# Patient Record
Sex: Male | Born: 1985 | Race: Black or African American | Hispanic: No | Smoking: Former smoker
Health system: Southern US, Community
[De-identification: ages and names within clinical notes are randomized; demographics above are authoritative.]

## PROBLEM LIST (undated history)

## (undated) DIAGNOSIS — J45909 Unspecified asthma, uncomplicated: Secondary | ICD-10-CM

---

## 2016-01-10 ENCOUNTER — Ambulatory Visit: Payer: Self-pay

## 2016-01-10 ENCOUNTER — Other Ambulatory Visit: Payer: Self-pay | Admitting: Occupational Medicine

## 2016-01-10 DIAGNOSIS — Z021 Encounter for pre-employment examination: Secondary | ICD-10-CM

## 2017-06-20 DIAGNOSIS — I10 Essential (primary) hypertension: Secondary | ICD-10-CM | POA: Insufficient documentation

## 2017-08-25 IMAGING — CR DG CHEST 1V
1 series · 1 of 1 positions shown · non-contrast
Comparison: None.

CLINICAL DATA: Pre-employment exam

EXAM:
CHEST 1 VIEW

[view not recorded]
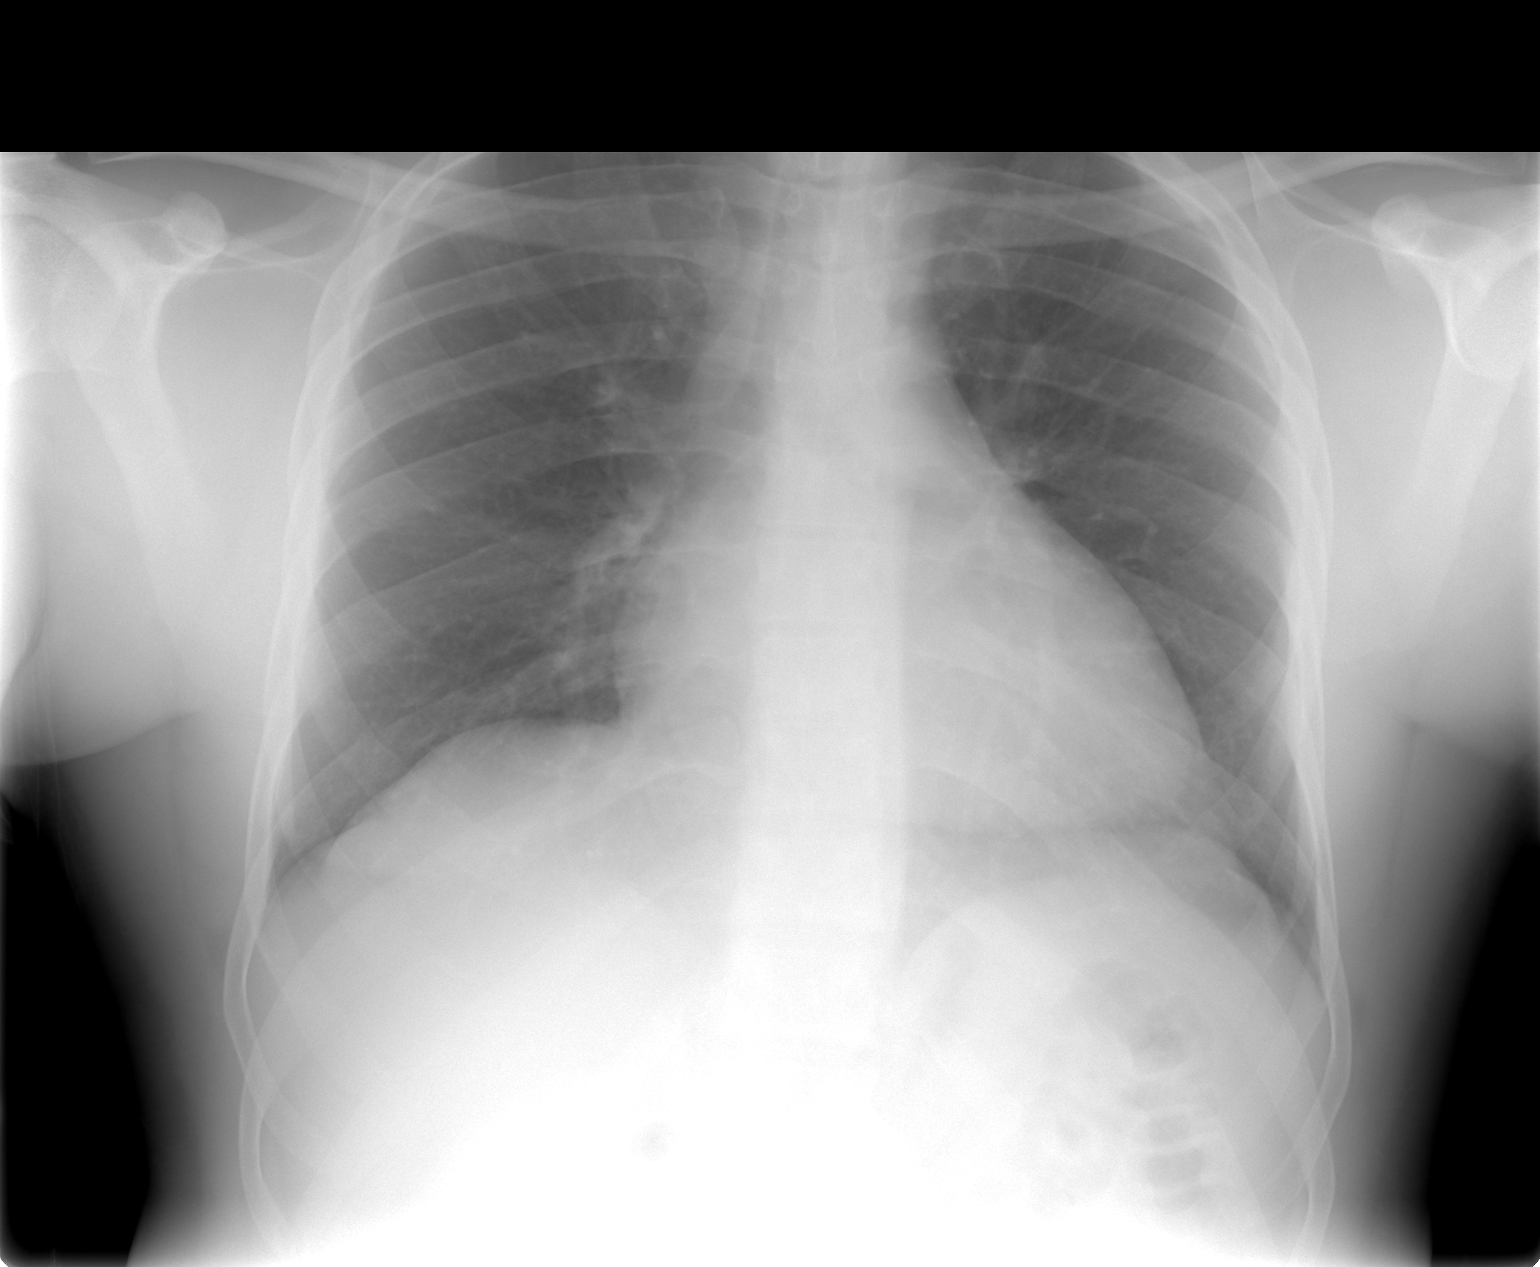

[1 of 1 positions shown; findings below may reference images not displayed]

FINDINGS: Cardiac shadow is mildly enlarged but accentuated by frontal
technique. No focal infiltrate or sizable effusion is seen. No bony
abnormality is noted.
IMPRESSION: No acute abnormality seen.

## 2017-12-28 ENCOUNTER — Ambulatory Visit: Payer: Self-pay | Admitting: Family Medicine

## 2020-04-16 ENCOUNTER — Emergency Department (HOSPITAL_BASED_OUTPATIENT_CLINIC_OR_DEPARTMENT_OTHER)
Admission: EM | Admit: 2020-04-16 | Discharge: 2020-04-16 | Disposition: A | Discharge status: Home / Self Care | Payer: Commercial Managed Care - PPO | Attending: Emergency Medicine | Admitting: Emergency Medicine

## 2020-04-16 ENCOUNTER — Other Ambulatory Visit: Payer: Self-pay

## 2020-04-16 ENCOUNTER — Encounter (HOSPITAL_BASED_OUTPATIENT_CLINIC_OR_DEPARTMENT_OTHER): Payer: Self-pay | Admitting: Emergency Medicine

## 2020-04-16 DIAGNOSIS — J45909 Unspecified asthma, uncomplicated: Secondary | ICD-10-CM | POA: Diagnosis not present

## 2020-04-16 DIAGNOSIS — R059 Cough, unspecified: Secondary | ICD-10-CM | POA: Diagnosis present

## 2020-04-16 DIAGNOSIS — R058 Other specified cough: Secondary | ICD-10-CM

## 2020-04-16 DIAGNOSIS — R0602 Shortness of breath: Secondary | ICD-10-CM | POA: Diagnosis not present

## 2020-04-16 DIAGNOSIS — Z87891 Personal history of nicotine dependence: Secondary | ICD-10-CM | POA: Insufficient documentation

## 2020-04-16 HISTORY — DX: Unspecified asthma, uncomplicated: J45.909

## 2020-04-16 MED ORDER — ALBUTEROL SULFATE HFA 108 (90 BASE) MCG/ACT IN AERS
1.0000 | INHALATION_SPRAY | Freq: Once | RESPIRATORY_TRACT | Status: AC
  Administered 2020-04-16: 1 via RESPIRATORY_TRACT
  Filled 2020-04-16: qty 6.7

## 2020-04-16 MED ORDER — CETIRIZINE HCL 10 MG PO TABS: 10.0000 mg | ORAL_TABLET | Freq: Every day | ORAL | 0 refills | Status: DC

## 2020-04-16 MED ORDER — PREDNISONE 10 MG PO TABS: 40.0000 mg | ORAL_TABLET | Freq: Every day | ORAL | 0 refills | Status: AC

## 2020-04-16 MED ORDER — AEROCHAMBER PLUS FLO-VU LARGE MISC
1.0000 | Freq: Once | Status: AC
  Administered 2020-04-16: 1
  Filled 2020-04-16: qty 1

## 2020-04-16 MED ORDER — OMEPRAZOLE 20 MG PO CPDR: 20.0000 mg | DELAYED_RELEASE_CAPSULE | Freq: Every day | ORAL | 0 refills | Status: DC

## 2020-04-16 MED ORDER — PREDNISONE 50 MG PO TABS
60.0000 mg | ORAL_TABLET | Freq: Once | ORAL | Status: AC
  Administered 2020-04-16: 60 mg via ORAL
  Filled 2020-04-16: qty 1

## 2020-04-16 NOTE — Discharge Instructions (Addendum)
Please take medications as prescribed.  I have prescribed you prednisone for 3 days.  I also recommend taking Zyrtec for the next 2 weeks as well as Prilosec.  As we discussed sometimes these nighttime cough can be caused by reflux and can occasionally be the only symptom of reflux.  Patient plenty of water.  Please monitor your symptoms may always return to the ER for any new or concerning symptoms.  Please follow-up with your primary care doctor at your appointment.

## 2020-04-16 NOTE — ED Triage Notes (Signed)
Pt states he been having issues with sob and coughing while lying down or sleeping for 5 days.  It wakes him up, he starts coughing and feels like he cannot catch his breath.  No acute respiratory distress.  Pt has history of asthma and has been using his daughters albuterol but does not have any meds for himself.

## 2020-04-16 NOTE — ED Provider Notes (Signed)
[facility] EMERGENCY DEPARTMENT Provider Note   CSN: [id number] Arrival date & time: 04/16/20  0932     History Chief Complaint  Patient presents with  . Shortness of Breath    Perry Johnston is a 36 y.o. male.  HPI Patient is a 35 year old male with a past medical history of asthma he states he has not had an inhaler for decades, he states that since Sunday he has been waking up with nocturnal cough.  He states that he feels he has had some wheezing and he feels short of breath because of how intensely he is coughing.  He has no history of sleep apnea and denies any daytime drowsiness.  He states that the coughing episodes will last for 5 to 10 minutes and he states that during these episodes he has several times used his daughter's nebulizer apparatus which he states seemed to help.  He also denies any daytime symptoms.  No chest pain during these episodes.  And states that during the day he has no shortness of breath, cough, congestion, fevers or chills.  No other associated symptoms.  No unilateral or bilateral leg swelling.  No history of heart issues, heart disease and he states that he has an appointment with his primary care doctor this upcoming Monday.    Past Medical History:  Diagnosis Date  . Asthma     There are no problems to display for this patient.   The histories are not reviewed yet. Please review them in the "History" navigator section and refresh this SmartLink.     History reviewed. No pertinent family history.  Social History   Tobacco Use  . Smoking status: Former Smoker    Types: Cigars    Quit date: 04/02/2020    Years since quitting: 0.0  . Smokeless tobacco: Never Used  Vaping Use  . Vaping Use: Never used    Home Medications Prior to Admission medications   Medication Sig Start Date End Date Taking? Authorizing Provider  cetirizine (ZYRTEC ALLERGY) 10 MG tablet Take 1 tablet (10 mg total) by mouth daily. 04/16/20  Yes Fondaw,  Wylder S, PA  omeprazole (PRILOSEC) 20 MG capsule Take 1 capsule (20 mg total) by mouth daily. 04/16/20  Yes Fondaw, Wylder S, PA  predniSONE (DELTASONE) 10 MG tablet Take 4 tablets (40 mg total) by mouth daily for 3 days. 04/16/20 04/19/20 Yes Gailen Shelter, PA    Allergies    Patient has no known allergies.  Review of Systems   Review of Systems  Constitutional: Negative for chills and fever.  HENT: Negative for congestion.   Eyes: Negative for pain.  Respiratory: Positive for cough (At night).   Cardiovascular: Negative for chest pain and leg swelling.  Gastrointestinal: Negative for abdominal pain and vomiting.  Genitourinary: Negative for dysuria.  Musculoskeletal: Negative for myalgias.  Skin: Negative for rash.  Neurological: Negative for dizziness and headaches.    Physical Exam Updated Vital Signs BP (!) 158/67   Pulse 65   Temp 98.3 F (36.8 C) (Oral)   Resp 16   Ht 5\' 7"  (1.702 m)   Wt 83.9 kg   SpO2 100%   BMI 28.98 kg/m   Physical Exam Vitals and nursing note reviewed.  Constitutional:      General: He is not in acute distress. HENT:     Head: Normocephalic and atraumatic.     Nose: Nose normal.  Eyes:     General: No scleral icterus. Cardiovascular:  Rate and Rhythm: Normal rate and regular rhythm.     Pulses: Normal pulses.     Heart sounds: Normal heart sounds.  Pulmonary:     Effort: Pulmonary effort is normal. No respiratory distress.     Breath sounds: No wheezing.     Comments: No tachypnea, speaking full sentences, lungs are clear to auscultation all fields, normal work of breathing Abdominal:     Palpations: Abdomen is soft.     Tenderness: There is no abdominal tenderness.  Musculoskeletal:     Cervical back: Normal range of motion.     Right lower leg: No edema.     Left lower leg: No edema.     Comments: Lower extremities are symmetric with no calf tenderness no lower extremity edema  Skin:    General: Skin is warm and dry.      Capillary Refill: Capillary refill takes less than 2 seconds.  Neurological:     Mental Status: He is alert. Mental status is at baseline.  Psychiatric:        Mood and Affect: Mood normal.        Behavior: Behavior normal.     ED Results / Procedures / Treatments   Labs (all labs ordered are listed, but only abnormal results are displayed) Labs Reviewed - No data to display  EKG None  Radiology No results found.  Procedures Procedures   Medications Ordered in ED Medications  predniSONE (DELTASONE) tablet 60 mg (has no administration in time range)  albuterol (VENTOLIN HFA) 108 (90 Base) MCG/ACT inhaler 1 puff (has no administration in time range)  AeroChamber Plus Flo-Vu Medium MISC 1 each (has no administration in time range)    ED Course  I have reviewed the triage vital signs and the nursing notes.  Pertinent labs & imaging results that were available during my care of the patient were reviewed by me and considered in my medical decision making (see chart for details).    MDM Rules/Calculators/A&P                          Patient is 35 year old male with past medical history significant for asthma presented today with complaints of nocturnal cough.  He has no daytime symptoms.  He is not on any inhalers and no other medications not on ACE inhibitor or ARB.  Differential includes nighttime symptoms of asthma, URI, reflux, PND, allergies  We will provide patient with albuterol inhaler, 3-day course of low-dose of prednisone, also will provide patient with AeroChamber and counseling on how to use inhaler.  We will also prescribe patient Prilosec and Zyrtec to use daily he will follow-up with his PCP.  Return precautions given.  I specifically have low suspicion for PE given that patient is PERC negative and has no chest pain.  Also take note ACS or myocarditis for similar reasons.  Questions answered best my ability.  Patient discharged at this time.  Final Clinical  Impression(s) / ED Diagnoses Final diagnoses:  Nocturnal cough    Rx / DC Orders ED Discharge Orders         Ordered    omeprazole (PRILOSEC) 20 MG capsule  Daily        04/16/20 1121    cetirizine (ZYRTEC ALLERGY) 10 MG tablet  Daily        04/16/20 1121    predniSONE (DELTASONE) 10 MG tablet  Daily        04/16/20 1121  Gailen Shelter, [place] 04/16/20 1126    Rozelle Logan, [place] 04/18/20 [id number]

## 2022-10-11 DIAGNOSIS — R062 Wheezing: Secondary | ICD-10-CM | POA: Diagnosis not present

## 2022-10-11 DIAGNOSIS — J4520 Mild intermittent asthma, uncomplicated: Secondary | ICD-10-CM | POA: Insufficient documentation

## 2022-10-11 DIAGNOSIS — J4531 Mild persistent asthma with (acute) exacerbation: Secondary | ICD-10-CM | POA: Diagnosis not present

## 2022-10-11 DIAGNOSIS — R0602 Shortness of breath: Secondary | ICD-10-CM | POA: Diagnosis not present

## 2022-10-11 DIAGNOSIS — I10 Essential (primary) hypertension: Secondary | ICD-10-CM | POA: Diagnosis not present

## 2022-10-17 DIAGNOSIS — I10 Essential (primary) hypertension: Secondary | ICD-10-CM | POA: Diagnosis not present

## 2022-10-21 DIAGNOSIS — L509 Urticaria, unspecified: Secondary | ICD-10-CM | POA: Diagnosis not present

## 2022-10-21 DIAGNOSIS — L209 Atopic dermatitis, unspecified: Secondary | ICD-10-CM | POA: Diagnosis not present

## 2023-01-04 ENCOUNTER — Encounter (HOSPITAL_COMMUNITY): Payer: Self-pay

## 2023-01-04 ENCOUNTER — Emergency Department (HOSPITAL_COMMUNITY)
Admission: EM | Admit: 2023-01-04 | Discharge: 2023-01-05 | Disposition: A | Discharge status: Home / Self Care | Payer: Self-pay | Attending: Emergency Medicine | Admitting: Emergency Medicine

## 2023-01-04 ENCOUNTER — Emergency Department (HOSPITAL_COMMUNITY): Payer: Self-pay

## 2023-01-04 ENCOUNTER — Other Ambulatory Visit: Payer: Self-pay

## 2023-01-04 DIAGNOSIS — R0989 Other specified symptoms and signs involving the circulatory and respiratory systems: Secondary | ICD-10-CM | POA: Diagnosis not present

## 2023-01-04 DIAGNOSIS — R0689 Other abnormalities of breathing: Secondary | ICD-10-CM | POA: Diagnosis not present

## 2023-01-04 DIAGNOSIS — J45909 Unspecified asthma, uncomplicated: Secondary | ICD-10-CM | POA: Insufficient documentation

## 2023-01-04 DIAGNOSIS — R0789 Other chest pain: Secondary | ICD-10-CM | POA: Diagnosis not present

## 2023-01-04 DIAGNOSIS — R079 Chest pain, unspecified: Secondary | ICD-10-CM | POA: Insufficient documentation

## 2023-01-04 DIAGNOSIS — R000 Tachycardia, unspecified: Secondary | ICD-10-CM | POA: Diagnosis not present

## 2023-01-04 DIAGNOSIS — I213 ST elevation (STEMI) myocardial infarction of unspecified site: Secondary | ICD-10-CM | POA: Diagnosis not present

## 2023-01-04 LAB — CBC WITH DIFFERENTIAL/PLATELET
Abs Immature Granulocytes: 0.01 10*3/uL (ref 0.00–0.07)
Basophils Absolute: 0.1 10*3/uL (ref 0.0–0.1)
Basophils Relative: 2 %
Eosinophils Absolute: 0.4 10*3/uL (ref 0.0–0.5)
Eosinophils Relative: 7 %
HCT: 43.6 % (ref 39.0–52.0)
Hemoglobin: 14.8 g/dL (ref 13.0–17.0)
Immature Granulocytes: 0 %
Lymphocytes Relative: 39 %
Lymphs Abs: 1.9 10*3/uL (ref 0.7–4.0)
MCH: 29 pg (ref 26.0–34.0)
MCHC: 33.9 g/dL (ref 30.0–36.0)
MCV: 85.3 fL (ref 80.0–100.0)
Monocytes Absolute: 0.4 10*3/uL (ref 0.1–1.0)
Monocytes Relative: 9 %
Neutro Abs: 2.1 10*3/uL (ref 1.7–7.7)
Neutrophils Relative %: 43 %
Platelets: 201 10*3/uL (ref 150–400)
RBC: 5.11 MIL/uL (ref 4.22–5.81)
RDW: 12.4 % (ref 11.5–15.5)
WBC: 4.8 10*3/uL (ref 4.0–10.5)
nRBC: 0 % (ref 0.0–0.2)

## 2023-01-04 LAB — COMPREHENSIVE METABOLIC PANEL
ALT: 21 U/L (ref 0–44)
AST: 22 U/L (ref 15–41)
Albumin: 3.9 g/dL (ref 3.5–5.0)
Alkaline Phosphatase: 44 U/L (ref 38–126)
Anion gap: 9 (ref 5–15)
BUN: 15 mg/dL (ref 6–20)
CO2: 23 mmol/L (ref 22–32)
Calcium: 9.3 mg/dL (ref 8.9–10.3)
Chloride: 106 mmol/L (ref 98–111)
Creatinine, Ser: 1.25 mg/dL — ABNORMAL HIGH (ref 0.61–1.24)
GFR, Estimated: >60 mL/min (ref >60)
Glucose, Bld: 92 mg/dL (ref 70–99)
Potassium: 3.7 mmol/L (ref 3.5–5.1)
Sodium: 138 mmol/L (ref 135–145)
Total Bilirubin: 0.8 mg/dL (ref <1.2)
Total Protein: 6.6 g/dL (ref 6.5–8.1)

## 2023-01-04 LAB — TROPONIN I (HIGH SENSITIVITY): Troponin I (High Sensitivity): 5 ng/L (ref <18)

## 2023-01-04 LAB — BRAIN NATRIURETIC PEPTIDE: B Natriuretic Peptide: 8 pg/mL (ref 0.0–100.0)

## 2023-01-04 NOTE — ED Provider Notes (Signed)
[place] EMERGENCY DEPARTMENT AT [facility] Provider Note   CSN: [id number] Arrival date & time: 01/04/23  2117     History  Chief Complaint  Patient presents with   Chest Pain    Perry Johnston is a 37 y.o. male with a PMH of asthma who presented to the ED for chest pain.  Patient reports that approximately 6 PM, he started having sharp, left-sided chest pain.  Denies radiation, getting worse with exertion, vomiting, or diaphoresis.  States it has been constant, but now has gone away since being in the ER.  Denies ever having pain like this before.  Denies history of heart attacks, blood clots and reports he thinks he has grown out of his asthma.  Per EMS, they administered the patient aspirin and nitroglycerin.  Patient otherwise denies fevers, chills, cough, congestion, shortness of breath, abdominal pain.   Chest Pain      Home Medications Prior to Admission medications   Medication Sig Start Date End Date Taking? Authorizing Provider  cetirizine (ZYRTEC ALLERGY) 10 MG tablet Take 1 tablet (10 mg total) by mouth daily. 04/16/20   Gailen Shelter, PA  omeprazole (PRILOSEC) 20 MG capsule Take 1 capsule (20 mg total) by mouth daily. 04/16/20   Gailen Shelter, PA      Allergies    Patient has no known allergies.    Review of Systems   Review of Systems  Cardiovascular:  Positive for chest pain.    Physical Exam Updated Vital Signs BP (!) 175/99   Pulse 96   Temp 98.2 F (36.8 C) (Oral)   Resp 18   Ht 5\' 7"  (1.702 m)   Wt 81.6 kg   SpO2 100%   BMI 28.19 kg/m  Physical Exam Constitutional:      General: He is not in acute distress.    Appearance: He is not ill-appearing.  HENT:     Head: Normocephalic and atraumatic.  Eyes:     Extraocular Movements: Extraocular movements intact.     Pupils: Pupils are equal, round, and reactive to light.  Cardiovascular:     Rate and Rhythm: Normal rate and regular rhythm.     Heart sounds: Normal heart  sounds. No murmur heard.    No friction rub. No gallop.  Pulmonary:     Effort: Pulmonary effort is normal.     Breath sounds: Normal breath sounds. No wheezing, rhonchi or rales.  Chest:     Comments: No rashes on chest, no tenderness to palpation over the chest wall Abdominal:     Tenderness: There is no abdominal tenderness. There is no guarding or rebound.  Musculoskeletal:     Cervical back: Normal range of motion and neck supple.     Right lower leg: No edema.     Left lower leg: No edema.  Neurological:     Mental Status: He is alert.    ED Results / Procedures / Treatments   Labs (all labs ordered are listed, but only abnormal results are displayed) Labs Reviewed  COMPREHENSIVE METABOLIC PANEL - Abnormal; Notable for the following components:      Result Value   Creatinine, Ser 1.25 (*)    All other components within normal limits  BRAIN NATRIURETIC PEPTIDE  CBC WITH DIFFERENTIAL/PLATELET  TROPONIN I (HIGH SENSITIVITY)  TROPONIN I (HIGH SENSITIVITY)    EKG None  Radiology DG Chest Portable 1 View  Result Date: 01/04/2023 CLINICAL DATA:  chest pain EXAM: PORTABLE CHEST 1  VIEW COMPARISON:  Chest x-ray 01/10/2016 FINDINGS: The heart and mediastinal contours are within normal limits. Low lung volumes . no focal consolidation. No pulmonary edema. No pleural effusion. No pneumothorax. No acute osseous abnormality. IMPRESSION: Low lung volumes with no active disease. Electronically Signed   By: Tish Frederickson M.D.   On: 01/04/2023 21:50    Procedures Procedures    Medications Ordered in ED Medications - No data to display  ED Course/ Medical Decision Making/ A&P                                 Medical Decision Making Amount and/or Complexity of Data Reviewed Labs: ordered. Radiology: ordered.   Vital signs stable, patient hypertensive.  Physical exam reassuring.  CBC with no leukocytosis or anemia.  Metabolic panel with no gross metabolic or electrolyte  abnormality.  BNP 8.0.  Initial troponin 5.  Patient has a low risk HEAR score, I feel he will be suitable for discharge with outpatient follow-up pending the result of his second troponin.  Presentation is not consistent with aortic dissection or esophageal rupture and chest x-ray showed no evidence of pneumothorax or pneumonia.  He has no rash concerning for shingles on his chest.  Patient is a low risk Wells score and PE can be excluded via the PERC rule.  Patient was signed out to the oncoming provider in stable condition, pending repeat troponin.        Final Clinical Impression(s) / ED Diagnoses Final diagnoses:  Chest pain, unspecified type    Rx / DC Orders ED Discharge Orders     None         Janyth Pupa, MD 01/04/23 2345    Glyn Ade, MD 01/05/23 [phone]

## 2023-01-04 NOTE — ED Triage Notes (Signed)
Patient BIB [facility] from home for CP starting 3 hrs ago, progressing over time and worse on exertion. Patient is hypertensive, 210/100, 324mg  aspirin and 3 nitroglycerin administered en route which improved CP from 7/10 to 4/10. Last BP 170/90, HR 100, 97% RA, RR 18, Cbg 106. No previous cardiac hx. Patient A&Ox4, ambulatory with EMS.

## 2023-01-05 LAB — TROPONIN I (HIGH SENSITIVITY): Troponin I (High Sensitivity): 5 ng/L (ref <18)

## 2023-01-05 NOTE — ED Notes (Signed)
Pt refused discharge BP

## 2023-01-05 NOTE — Discharge Instructions (Signed)
Your heart markers looked good.  We recommend that you follow-up with your regular doctor.  Return for new or worsening symptoms.

## 2023-01-05 NOTE — ED Provider Notes (Signed)
Patient requesting discharge.  Repeat trop is pending.  12:37 AM Repeat trop is flat and negative.  DC to home with PCP followup.   Roxy Horseman, PA-C 01/05/23 0038    Gilda Crease, MD 01/05/23 [phone]

## 2023-02-01 ENCOUNTER — Telehealth: Payer: Self-pay | Admitting: General Practice

## 2023-02-01 ENCOUNTER — Ambulatory Visit: Payer: BC Managed Care – PPO | Admitting: Internal Medicine

## 2023-02-01 NOTE — Telephone Encounter (Signed)
Pt came in late. Arrive at 2.19pm for his 2.00pm  - approved 1x reschedule

## 2023-02-14 ENCOUNTER — Ambulatory Visit: Payer: BC Managed Care – PPO | Admitting: Internal Medicine

## 2023-02-14 ENCOUNTER — Encounter: Payer: Self-pay | Admitting: Internal Medicine

## 2023-02-14 VITALS — BP 120/80 | HR 61 | Temp 97.7°F | Ht 67.0 in | Wt 182.0 lb

## 2023-02-14 DIAGNOSIS — R030 Elevated blood-pressure reading, without diagnosis of hypertension: Secondary | ICD-10-CM | POA: Insufficient documentation

## 2023-02-14 DIAGNOSIS — J4520 Mild intermittent asthma, uncomplicated: Secondary | ICD-10-CM | POA: Diagnosis not present

## 2023-02-14 MED ORDER — ALBUTEROL SULFATE HFA 108 (90 BASE) MCG/ACT IN AERS: 2.0000 | INHALATION_SPRAY | RESPIRATORY_TRACT | 2 refills | Status: DC | PRN

## 2023-02-14 NOTE — Progress Notes (Signed)
 [facility] [address] [place] [address] Dept: [phone] Dept Fax: [phone]  New Patient Office Visit  Subjective:   Perry Johnston 1985/11/17 02/14/2023  Chief Complaint  Patient presents with   Establish Care    HPI: Perry Johnston presents today to establish care at [facility]. Introduced to publishing rights manager role and practice setting.  All questions answered.  Concerns: See below   Discussed the use of AI scribe software for clinical note transcription with the patient, who gave verbal consent to proceed.  History of Present Illness   The patient, with a history of asthma and elevated BP readings in the past without diagnosis of HTN, presents to establish care. They report using an albuterol  inhaler for asthma approximately twice a week, typically after eating large meals. They deny experiencing asthma symptoms during exercise. They have run out of their inhaler and request a refill.  The patient also reports a history of elevated BP readings, with a recent episode of severe chest pain leading to an emergency room visit. At that time, their blood pressure was nearly 200. They deny currently taking any medication for high blood pressure. They have made lifestyle changes, including dietary modifications and increased exercise, to manage their blood pressure. They report a recent blood pressure reading of 130 systolic when they checked it at the pharmacy. They express a desire to avoid taking medication for blood pressure if possible.  The patient also mentions a family history of heart disease, with both parents having heart conditions. They express concern about this and a desire to take preventative measures. They request a physical examination, as they have not had one in over a year.         The following portions of the patient's history were reviewed and updated as appropriate:  past medical history, past surgical history, family history, social history, allergies, medications, and problem list.   Patient Active Problem List   Diagnosis Date Noted   Elevated BP without diagnosis of hypertension 02/14/2023   Mild intermittent asthma without complication 10/11/2022   Past Medical History:  Diagnosis Date   Asthma    History reviewed. No pertinent surgical history. Family History  Problem Relation Age of Onset   Heart disease Mother    Heart disease Father     Current Outpatient Medications:    albuterol  (VENTOLIN  HFA) 108 (90 Base) MCG/ACT inhaler, Inhale 2 puffs into the lungs every 4 (four) hours as needed for wheezing or shortness of breath., Disp: 18 g, Rfl: 2 No Known Allergies  ROS: A complete ROS was performed with pertinent positives/negatives noted in the HPI. The remainder of the ROS are negative.   Objective:   Today's Vitals   02/14/23 0842 02/14/23 0900  BP: 104/70 120/80  Pulse: 61   Temp: 97.7 F (36.5 C)   TempSrc: Temporal   SpO2: 98%   Weight: 182 lb (82.6 kg)   Height: 5' 7 (1.702 m)     GENERAL: Well-appearing, in NAD. Well nourished.  SKIN: Pink, warm and dry. No rash, lesion, ulceration, or ecchymoses.  NECK: Trachea midline. Full ROM w/o pain or tenderness. No lymphadenopathy.  RESPIRATORY: Chest wall symmetrical. Respirations even and non-labored. Breath sounds clear to auscultation bilaterally.  CARDIAC: S1, S2 present, regular rate and rhythm. Peripheral pulses 2+ bilaterally.  EXTREMITIES: Without clubbing, cyanosis, or edema.  NEUROLOGIC:  Steady, even gait.  PSYCH/MENTAL STATUS: Alert, oriented x 3. Cooperative, appropriate mood and  affect.   Health Maintenance Due  Topic Date Due   HIV Screening  Never done   Hepatitis C Screening  Never done   COVID-19 Vaccine (3 - 2024-25 season) 10/08/2022    No results found for any visits on 02/14/23.  Assessment & Plan:  Assessment and Plan    Asthma Infrequent  use of Albuterol  inhaler, approximately twice a week, primarily after meals. No exercise-induced symptoms reported. -Refill Albuterol  inhaler prescription and send to [facility] pharmacy.  Elevated BP without diagnosis of Hypertension -Continue lifestyle modifications. - discussed low sodium diet  -Check blood pressure regularly at home or at local pharmacy. -Return to clinic if blood pressure readings consistently exceed 140/90.  General Health Maintenance / Follow-up Plans -Schedule physical examination in six weeks. -Plan for fasting blood work at time of physical examination.       No orders of the defined types were placed in this encounter.  Meds ordered this encounter  Medications   albuterol  (VENTOLIN  HFA) 108 (90 Base) MCG/ACT inhaler    Sig: Inhale 2 puffs into the lungs every 4 (four) hours as needed for wheezing or shortness of breath.    Dispense:  18 g    Refill:  2    Supervising Provider:   SEBASTIAN BEVERLEY NOVAK [[id number]]    Return in about 6 weeks (around 03/28/2023) for Fasting Annual Physical Exam.   Rosina Senters, FNP

## 2023-02-14 NOTE — Patient Instructions (Signed)
 BLOOD PRESSURE: Obtain an automatic blood pressure machine if you do not have one.   Check your blood pressure at home, write down blood pressure readings and bring to next appointment.  Goal is BP less than 140/90 consistently.  Adhere to a low salt diet ( no more than 1500mg  of salt/sodium per day) and exercise regularly   The nutrition facts label is a good place to find how much sodium is in foods. Look for products with no more than 400 mg of sodium per serving.  Remember that 1.5 g = 1500 mg.  The food label may also list foods as:  Sodium-free: Less than 5 mg in a serving.  Very low sodium: 35 mg or less in a serving.  Low-sodium: 140 mg or less in a serving.  Light in sodium: 50% less sodium in a serving. For example, if a food that usually has 300 mg of sodium is changed to become light in sodium, it will have 150 mg of sodium.  Reduced sodium: 25% less sodium in a serving. For example, if a food that usually has 400 mg of sodium is changed to reduced sodium, it will have 300 mg of sodium.

## 2023-02-21 ENCOUNTER — Ambulatory Visit: Payer: BC Managed Care – PPO | Admitting: Internal Medicine

## 2023-03-09 ENCOUNTER — Encounter: Payer: Self-pay | Admitting: Family Medicine

## 2023-03-09 ENCOUNTER — Ambulatory Visit: Payer: BC Managed Care – PPO | Admitting: Family Medicine

## 2023-03-09 VITALS — BP 146/84 | HR 76 | Temp 97.5°F | Wt 182.6 lb

## 2023-03-09 DIAGNOSIS — Z113 Encounter for screening for infections with a predominantly sexual mode of transmission: Secondary | ICD-10-CM | POA: Diagnosis not present

## 2023-03-09 NOTE — Addendum Note (Signed)
Addended by: Trudee Kuster on: 03/09/2023 04:04 PM   Modules accepted: Orders

## 2023-03-09 NOTE — Progress Notes (Signed)
Assessment/Plan:   Assessment and Plan    Sexually Transmitted Infection (STI) Screening Presents for routine STI screening. Asymptomatic with no history of STIs or known exposures. In a relationship and seeks testing for reassurance and safety. Informed consent obtained; discussed barrier protection for STI and pregnancy prevention. - Order urine and blood tests for gonorrhea, chlamydia, trichomonas, HIV, syphilis, hepatitis B, hepatitis C, and mycoplasma - Advise use of barrier protection - Provide results through MyChart.        There are no discontinued medications.  Return if symptoms worsen or fail to improve.    Subjective:   Encounter date: 03/09/2023  Perry Johnston is a 38 y.o. male who has Mild intermittent asthma without complication and Elevated BP without diagnosis of hypertension on their problem list..   He  has a past medical history of Asthma.Marland Kitchen   He presents with chief complaint of Medical Management of Chronic Issues (STD testing and HIV testing ) .   Discussed the use of AI scribe software for clinical note transcription with the patient, who gave verbal consent to proceed.  History of Present Illness   The patient presents for STD testing.  He is seeking STD testing as a precautionary measure to ensure safety in his relationship. This is part of routine screening in a new relationship.  No current symptoms such as sores or urinary discharge. No history of STDs or known exposure. No abdominal pain, nausea, or vomiting.       Review of Systems  Constitutional:  Negative for chills, diaphoresis, fever, malaise/fatigue and weight loss.  HENT:  Negative for congestion, ear discharge, ear pain and hearing loss.   Eyes:  Negative for blurred vision, double vision, photophobia, pain, discharge and redness.  Respiratory:  Negative for cough, sputum production, shortness of breath and wheezing.   Cardiovascular:  Negative for chest pain and palpitations.   Gastrointestinal:  Negative for abdominal pain, blood in stool, constipation, diarrhea, heartburn, melena, nausea and vomiting.  Genitourinary:  Negative for dysuria, flank pain, frequency, hematuria and urgency.  Musculoskeletal:  Negative for myalgias.  Skin:  Negative for itching and rash.  Neurological:  Negative for dizziness, tingling, tremors, speech change, seizures, loss of consciousness, weakness and headaches.  Psychiatric/Behavioral:  Negative for depression, hallucinations, memory loss, substance abuse and suicidal ideas. The patient does not have insomnia.   All other systems reviewed and are negative.   No past surgical history on file.  Outpatient Medications Prior to Visit  Medication Sig Dispense Refill   albuterol (VENTOLIN HFA) 108 (90 Base) MCG/ACT inhaler Inhale 2 puffs into the lungs every 4 (four) hours as needed for wheezing or shortness of breath. 18 g 2   No facility-administered medications prior to visit.    Family History  Problem Relation Age of Onset   Heart disease Mother    Heart disease Father     Social History   Socioeconomic History   Marital status: Unknown    Spouse name: Not on file   Number of children: Not on file   Years of education: Not on file   Highest education level: Not on file  Occupational History   Not on file  Tobacco Use   Smoking status: Former    Types: Cigars    Quit date: 04/02/2020    Years since quitting: 2.9   Smokeless tobacco: Never  Vaping Use   Vaping status: Never Used  Substance and Sexual Activity   Alcohol use: Not on file  Drug use: Not on file   Sexual activity: Not on file  Other Topics Concern   Not on file  Social History Narrative   Not on file   Social Drivers of Health   Financial Resource Strain: Low Risk  (01/23/2022)   Received from [facility]   Overall Financial Resource Strain (CARDIA)    Difficulty of Paying Living Expenses: Not very hard  Food Insecurity: No Food  Insecurity (01/23/2022)   [facility]   Hunger Vital Sign    Worried About Running Out of Food in the Last Year: Never true    Ran Out of Food in the Last Year: Never true  Transportation Needs: No Transportation Needs (08/17/2021)   Received from [facility] - [facility]    Lack of Transportation (Medical): No    Lack of Transportation (Non-Medical): No  Physical Activity: Sufficiently Active (01/23/2022)   Received from [facility]   Exercise Vital Sign    Days of Exercise per Week: 5 days    Minutes of Exercise per Session: 60 min  Stress: Not on file (12/14/2022)  Social Connections: Socially Integrated (01/23/2022)   Received from [facility]   Social Network    How would you rate your social network (family, work, friends)?: Good participation with social networks  Intimate Partner Violence: Not At Risk (01/23/2022)   Received from [facility]   HITS    Over the last 12 months how often did your partner physically hurt you?: Never    Over the last 12 months how often did your partner insult you or talk down to you?: Never    Over the last 12 months how often did your partner threaten you with physical harm?: Never    Over the last 12 months how often did your partner scream or curse at you?: Never                                                                                                  Objective:  Physical Exam: BP (!) 146/84   Pulse 76   Temp (!) 97.5 F (36.4 C) (Temporal)   Wt 182 lb 9.6 oz (82.8 kg)   SpO2 98%   BMI 28.60 kg/m     Physical Exam Constitutional:      Appearance: Normal appearance.  HENT:     Head: Normocephalic and atraumatic.     Right Ear: Hearing normal.     Left Ear: Hearing normal.     Nose: Nose normal.  Eyes:     General: No scleral icterus.       Right eye: No discharge.        Left eye: No discharge.     Extraocular Movements: Extraocular movements intact.  Cardiovascular:      Comments: No cyanosis, no JVD Pulmonary:     Effort: Pulmonary effort is normal.     Comments: No auditory wheezing Musculoskeletal:     Comments: Normal Ambulation. No clubbing  Skin:    General: Skin is warm.     Findings: No rash.  Neurological:     General: No focal deficit present.     Mental Status: He is alert.     Cranial Nerves: No cranial nerve deficit.  Psychiatric:        Mood and Affect: Mood normal.        Behavior: Behavior normal.        Thought Content: Thought content normal.        Judgment: Judgment normal.     DG Chest Portable 1 View Result Date: 01/04/2023 CLINICAL DATA:  chest pain EXAM: PORTABLE CHEST 1 VIEW COMPARISON:  Chest x-ray 01/10/2016 FINDINGS: The heart and mediastinal contours are within normal limits. Low lung volumes . no focal consolidation. No pulmonary edema. No pleural effusion. No pneumothorax. No acute osseous abnormality. IMPRESSION: Low lung volumes with no active disease. Electronically Signed   By: Tish Frederickson M.D.   On: 01/04/2023 21:50    Recent Results (from the past 2160 hours)  Troponin I (High Sensitivity)     Status: None   Collection Time: 01/04/23  9:40 PM  Result Value Ref Range   Troponin I (High Sensitivity) 5 <18 ng/L    Comment: (NOTE) Elevated high sensitivity troponin I (hsTnI) values and significant  changes across serial measurements may suggest ACS but many other  chronic and acute conditions are known to elevate hsTnI results.  Refer to the "Links" section for chest pain algorithms and additional  guidance. Performed at [facility], [address] [place], [place] [address]   Brain natriuretic peptide     Status: None   Collection Time: 01/04/23  9:40 PM  Result Value Ref Range   B Natriuretic Peptide 8.0 0.0 - 100.0 pg/mL    Comment: Performed at [facility], [address], [place], [place] [address]  CBC with Differential     Status: None   Collection Time: 01/04/23  9:40 PM   Result Value Ref Range   WBC 4.8 4.0 - 10.5 K/uL   RBC 5.11 4.22 - 5.81 MIL/uL   Hemoglobin 14.8 13.0 - 17.0 g/dL   HCT 09.8 11.9 - 14.7 %   MCV 85.3 80.0 - 100.0 fL   MCH 29.0 26.0 - 34.0 pg   MCHC 33.9 30.0 - 36.0 g/dL   RDW 82.9 56.2 - 13.0 %   Platelets 201 150 - 400 K/uL   nRBC 0.0 0.0 - 0.2 %   Neutrophils Relative % 43 %   Neutro Abs 2.1 1.7 - 7.7 K/uL   Lymphocytes Relative 39 %   Lymphs Abs 1.9 0.7 - 4.0 K/uL   Monocytes Relative 9 %   Monocytes Absolute 0.4 0.1 - 1.0 K/uL   Eosinophils Relative 7 %   Eosinophils Absolute 0.4 0.0 - 0.5 K/uL   Basophils Relative 2 %   Basophils Absolute 0.1 0.0 - 0.1 K/uL   Immature Granulocytes 0 %   Abs Immature Granulocytes 0.01 0.00 - 0.07 K/uL    Comment: Performed at [facility], [address], [place], [place] [address]  Comprehensive metabolic panel     Status: Abnormal   Collection Time: 01/04/23  9:40 PM  Result Value Ref Range   Sodium 138 135 - 145 mmol/L   Potassium 3.7 3.5 - 5.1 mmol/L   Chloride 106 98 - 111 mmol/L   CO2 23 22 - 32 mmol/L   Glucose, Bld 92 70 - 99 mg/dL    Comment: Glucose reference range applies only to samples taken after  fasting for at least 8 hours.   BUN 15 6 - 20 mg/dL   Creatinine, Ser 1.61 (H) 0.61 - 1.24 mg/dL   Calcium 9.3 8.9 - 09.6 mg/dL   Total Protein 6.6 6.5 - 8.1 g/dL   Albumin 3.9 3.5 - 5.0 g/dL   AST 22 15 - 41 U/L   ALT 21 0 - 44 U/L   Alkaline Phosphatase 44 38 - 126 U/L   Total Bilirubin 0.8 <1.2 mg/dL   GFR, Estimated >04 >54 mL/min    Comment: (NOTE) Calculated using the CKD-EPI Creatinine Equation (2021)    Anion gap 9 5 - 15    Comment: Performed at [facility], [address] [place], [place] [address]  Troponin I (High Sensitivity)     Status: None   Collection Time: 01/04/23 11:49 PM  Result Value Ref Range   Troponin I (High Sensitivity) 5 <18 ng/L    Comment: (NOTE) Elevated high sensitivity troponin I (hsTnI) values and significant  changes  across serial measurements may suggest ACS but many other  chronic and acute conditions are known to elevate hsTnI results.  Refer to the "Links" section for chest pain algorithms and additional  guidance. Performed at [facility], [address] [place], [place] [address]         Garner Nash, MD, MS

## 2023-03-09 NOTE — Addendum Note (Signed)
Addended by: Fanny Bien B on: 03/09/2023 04:06 PM   Modules accepted: Orders

## 2023-03-10 LAB — HIV ANTIBODY (ROUTINE TESTING W REFLEX): HIV 1&2 Ab, 4th Generation: NONREACTIVE

## 2023-03-10 LAB — HEPATITIS B CORE ANTIBODY, TOTAL: Hep B Core Total Ab: NONREACTIVE

## 2023-03-10 LAB — RPR: RPR Ser Ql: NONREACTIVE

## 2023-03-10 LAB — HEPATITIS B SURFACE ANTIBODY,QUALITATIVE: Hep B S Ab: REACTIVE — AB

## 2023-03-10 LAB — HCV AB W REFLEX TO QUANT PCR: HCV Ab: NONREACTIVE

## 2023-03-10 LAB — HCV INTERPRETATION

## 2023-03-10 LAB — HEPATITIS B SURFACE ANTIGEN: Hepatitis B Surface Ag: NONREACTIVE

## 2023-03-11 LAB — CHLAMYDIA/GC NAA, CONFIRMATION

## 2023-03-11 LAB — SPECIMEN STATUS REPORT

## 2023-03-12 ENCOUNTER — Telehealth: Payer: Self-pay

## 2023-03-12 LAB — CULTURE INDICATED

## 2023-03-12 LAB — URINALYSIS W MICROSCOPIC + REFLEX CULTURE
Bacteria, UA: NONE SEEN /HPF
Bilirubin Urine: NEGATIVE
Glucose, UA: NEGATIVE
Hgb urine dipstick: NEGATIVE
Hyaline Cast: NONE SEEN /LPF
Nitrites, Initial: NEGATIVE
Protein, ur: NEGATIVE
RBC / HPF: NONE SEEN /[HPF] (ref 0–2)
Specific Gravity, Urine: 1.026 (ref 1.001–1.035)
pH: 7 (ref 5.0–8.0)

## 2023-03-12 LAB — URINE CULTURE
MICRO NUMBER:: 16029172
SPECIMEN QUALITY:: ADEQUATE

## 2023-03-12 LAB — TRICHOMONAS VAGINALIS, PROBE AMP: Trichomonas vaginalis RNA: NOT DETECTED

## 2023-03-12 NOTE — Telephone Encounter (Signed)
Spoke with Malen Gauze at [facility] she stated she put in the order for add on lab. She stated that if they aren't able to add on lab we will be notified by fax.

## 2023-03-12 NOTE — Telephone Encounter (Signed)
Copied from CRM [id number]. Topic: Clinical - Lab/Test Results >> Mar 12, 2023  9:22 AM Lorin Glass B wrote: Reason for CRM: Patient calling to check lab results from 03/09/2023. No visible provider comments to relay, but abnormal notes marked on several. Patient also confused as to why test results for chlamydia is marked as cancelled on mychart. Callback# [phone]  Forwarding patient message above. Please advise.

## 2023-03-12 NOTE — Telephone Encounter (Signed)
-----   Message from Garnette Gunner sent at 03/12/2023  1:12 PM EST ----- Can we please add on the urine gc/chlamydia? ----- Message ----- From: Janace Hoard Lab Results In Sent: 03/10/2023   4:19 AM EST To: Garnette Gunner, MD

## 2023-03-13 ENCOUNTER — Ambulatory Visit: Payer: BC Managed Care – PPO | Admitting: Internal Medicine

## 2023-03-13 LAB — GENITAL MYCOPLASMAS NAA, URINE: Mycoplasma genitalium NAA: NEGATIVE

## 2023-03-13 NOTE — Telephone Encounter (Signed)
Patient was seen by Dr. Janee Morn on 03/08/22  Copied from CRM [id number]. Topic: Clinical - Lab/Test Results >> Mar 13, 2023 11:41 AM Thomes Dinning wrote: Reason for CRM: Patient is requesting a call back at [phone] to discuss lab results.

## 2023-03-15 ENCOUNTER — Encounter: Payer: Self-pay | Admitting: Family Medicine

## 2023-03-15 NOTE — Telephone Encounter (Signed)
 Patient reviewed results via mychart

## 2023-03-26 ENCOUNTER — Encounter: Payer: BC Managed Care – PPO | Admitting: Internal Medicine

## 2023-03-29 ENCOUNTER — Encounter: Payer: BC Managed Care – PPO | Admitting: Internal Medicine

## 2023-04-03 LAB — GC/CHLAMYDIA PROBE AMP
Chlamydia trachomatis, NAA: NEGATIVE
Neisseria Gonorrhoeae by PCR: NEGATIVE

## 2023-04-03 LAB — SPECIMEN STATUS REPORT

## 2023-04-05 ENCOUNTER — Encounter: Payer: BC Managed Care – PPO | Admitting: Internal Medicine

## 2023-04-05 NOTE — Progress Notes (Deleted)
 Subjective:   Perry Johnston 28-Oct-1985 04/06/2023  CC: No chief complaint on file.   HPI: Perry Johnston is a 38 y.o. male who presents for a routine health maintenance exam.  Labs collected at time of visit.   HEALTH SCREENINGS: - PSA (50+): Not applicable  No results found for: "PSA1", "PSA"   - Colonoscopy (45+): Not applicable  - AAA Screening: Not applicable  Men age 35-75 who have ever smoked - Lung Cancer screening with low-dose CT: Not applicable-  Adults age 68-80 who are current cigarette smokers or quit within the last 15 years. Must have 20 pack year history.   Depression and Anxiety Screen done today and results listed below:     02/14/2023    9:04 AM  Depression screen PHQ 2/9  Decreased Interest 0  Down, Depressed, Hopeless 1  PHQ - 2 Score 1  Altered sleeping 0  Tired, decreased energy 1  Change in appetite 0  Feeling bad or failure about yourself  1  Trouble concentrating 1  Moving slowly or fidgety/restless 0  Suicidal thoughts 0  PHQ-9 Score 4  Difficult doing work/chores Not difficult at all      02/14/2023    9:04 AM  GAD 7 : Generalized Anxiety Score  Nervous, Anxious, on Edge 0  Control/stop worrying 1  Worry too much - different things 1  Trouble relaxing 0  Restless 0  Easily annoyed or irritable 0  Afraid - awful might happen 0  Total GAD 7 Score 2  Anxiety Difficulty Not difficult at all    IMMUNIZATIONS:  - Tdap: Tetanus vaccination status reviewed: last tetanus booster within 10 years. - Influenza: {Blank single:19197::"Up to date","Administered today","Postponed to flu season","Refused","Given elsewhere"}   Past medical history, surgical history, medications, allergies, family history and social history reviewed with patient today and changes made to appropriate areas of the chart.   Past Medical History:  Diagnosis Date   Asthma     No past surgical history on file.  Current Outpatient Medications on File Prior to  Visit  Medication Sig   albuterol (VENTOLIN HFA) 108 (90 Base) MCG/ACT inhaler Inhale 2 puffs into the lungs every 4 (four) hours as needed for wheezing or shortness of breath.   No current facility-administered medications on file prior to visit.    No Known Allergies   Social History   Socioeconomic History   Marital status: Unknown    Spouse name: Not on file   Number of children: Not on file   Years of education: Not on file   Highest education level: Not on file  Occupational History   Not on file  Tobacco Use   Smoking status: Former    Types: Cigars    Quit date: 04/02/2020    Years since quitting: 3.0   Smokeless tobacco: Never  Vaping Use   Vaping status: Never Used  Substance and Sexual Activity   Alcohol use: Not on file   Drug use: Not on file   Sexual activity: Not on file  Other Topics Concern   Not on file  Social History Narrative   Not on file   Social Drivers of Health   Financial Resource Strain: Low Risk  (01/23/2022)   Received from [facility]   Overall Financial Resource Strain (CARDIA)    Difficulty of Paying Living Expenses: Not very hard  Food Insecurity: No Food Insecurity (01/23/2022)   Received from [facility]   Hunger Vital Sign    Worried  About Running Out of Food in the Last Year: Never true    Ran Out of Food in the Last Year: Never true  Transportation Needs: No Transportation Needs (08/17/2021)   Received from [facility] - Transportation    Lack of Transportation (Medical): No    Lack of Transportation (Non-Medical): No  Physical Activity: Sufficiently Active (01/23/2022)   Received from [facility]   Exercise Vital Sign    Days of Exercise per Week: 5 days    Minutes of Exercise per Session: 60 min  Stress: Not on file (12/14/2022)  Social Connections: Socially Integrated (01/23/2022)   Received from [facility]   Social Network    How would you rate your social network (family, work, friends)?: Good  participation with social networks  Intimate Partner Violence: Not At Risk (01/23/2022)   Received from [facility]   HITS    Over the last 12 months how often did your partner physically hurt you?: Never    Over the last 12 months how often did your partner insult you or talk down to you?: Never    Over the last 12 months how often did your partner threaten you with physical harm?: Never    Over the last 12 months how often did your partner scream or curse at you?: Never   Social History   Tobacco Use  Smoking Status Former   Types: Cigars   Quit date: 04/02/2020   Years since quitting: 3.0  Smokeless Tobacco Never   Social History   Substance and Sexual Activity  Alcohol Use None     Family History  Problem Relation Age of Onset   Heart disease Mother    Heart disease Father      ROS: Denies fever, fatigue, unexplained weight loss/gain, hearing or vision changes, cardiac or respiratory complaints. Denies neurological deficits, musculoskeletal complaints, gastrointestinal or genitourinary complaints, mental health complaints, and skin changes.   Objective:   There were no vitals filed for this visit.  GENERAL APPEARANCE: Well-appearing, in NAD. Well nourished.  SKIN: Pink, warm and dry. Turgor normal. No rash, lesion, ulceration, or ecchymoses. Hair evenly distributed.  HEENT: HEAD: Normocephalic.  EYES: PERRLA. EOMI. Lids intact w/o defect. Sclera white, Conjunctiva pink w/o exudate.  EARS: External ear w/o redness, swelling, masses or lesions. EAC clear. TM's intact, translucent w/o bulging, appropriate landmarks visualized. Appropriate acuity to conversational tones.  NOSE: Septum midline w/o deformity. Nares patent, mucosa pink and non-inflamed w/o drainage. No sinus tenderness.  THROAT: Uvula midline. Oropharynx clear. Tonsils non-inflamed w/o exudate ***. Oral mucosa pink and moist.  NECK: Supple, Trachea midline. Full ROM w/o pain or tenderness. No  lymphadenopathy. Thyroid non-tender w/o enlargement or palpable masses.  RESPIRATORY: Chest wall symmetrical w/o masses. Respirations even and non-labored. Breath sounds clear to auscultation bilaterally. No wheezes, rales, rhonchi, or crackles. CARDIAC: S1, S2 present, regular rate and rhythm. No gallops, murmurs, rubs, or clicks. No carotid bruits. Capillary refill <2 seconds. Peripheral pulses 2+ bilaterally. GI: Abdomen soft w/o distention. Normoactive bowel sounds. No palpable masses or tenderness. No guarding or rebound tenderness. Liver and spleen w/o tenderness or enlargement. No CVA tenderness.  GU: *** deferred exam. MSK: Muscle tone and strength appropriate for age, w/o atrophy or abnormal movement. EXTREMITIES: Active ROM intact, w/o tenderness, crepitus, or contracture. No obvious joint deformities or effusions. No clubbing, edema, or cyanosis.  NEUROLOGIC: CN's II-XII intact. Motor strength symmetrical with no obvious weakness. No sensory deficits. DTR 2+ symmetric bilaterally.  Steady, even gait.  PSYCH/MENTAL STATUS: Alert, oriented x 3. Cooperative, appropriate mood and affect.   Results for orders placed or performed in visit on 03/09/23  HCV Ab w Reflex to Quant PCR   Collection Time: 03/09/23  4:10 PM  Result Value Ref Range   HCV Ab Non Reactive Non Reactive  HIV Antibody (routine testing w rflx)   Collection Time: 03/09/23  4:10 PM  Result Value Ref Range   HIV 1&2 Ab, 4th Generation NON-REACTIVE NON-REACTIVE  RPR   Collection Time: 03/09/23  4:10 PM  Result Value Ref Range   RPR Ser Ql NON-REACTIVE NON-REACTIVE  Hepatitis B core antibody, total   Collection Time: 03/09/23  4:10 PM  Result Value Ref Range   Hep B Core Total Ab NON-REACTIVE NON-REACTIVE  Hepatitis B surface antibody,qualitative   Collection Time: 03/09/23  4:10 PM  Result Value Ref Range   Hep B S Ab REACTIVE (A) NON-REACTIVE  Hepatitis B surface antigen   Collection Time: 03/09/23  4:10 PM   Result Value Ref Range   Hepatitis B Surface Ag NON-REACTIVE NON-REACTIVE  Interpretation:   Collection Time: 03/09/23  4:10 PM  Result Value Ref Range   HCV Interp 1: Comment   Trichomonas vaginalis, RNA   Collection Time: 03/09/23  4:19 PM   Specimen: Urine  Result Value Ref Range   Trichomonas vaginalis RNA NOT DETECTED NOT DETECTED  Chlamydia/GC NAA, Confirmation   Collection Time: 03/09/23  4:19 PM   Specimen: Urine   Urine  Result Value Ref Range   Chlamydia trachomatis, NAA CANCELED    Neisseria gonorrhoeae, NAA CANCELED   Urine Culture   Collection Time: 03/09/23  4:19 PM  Result Value Ref Range   MICRO NUMBER: [id number]    SPECIMEN QUALITY: Adequate    Sample Source URINE    STATUS: FINAL    ISOLATE 1: Group G Streptococcus (A)   GC/Chlamydia Probe Amp   Collection Time: 03/09/23  4:19 PM   Urine  Result Value Ref Range   Chlamydia trachomatis, NAA Negative Negative   Neisseria Gonorrhoeae by PCR Negative Negative  Genital Mycoplasmas NAA, Urine   Collection Time: 03/09/23  4:19 PM  Result Value Ref Range   Mycoplasma genitalium NAA Negative Negative   Mycoplasma hominis NAA Negative Negative   Ureaplasma spp NAA Positive (A) Negative  Urinalysis w microscopic + reflex cultur   Collection Time: 03/09/23  4:19 PM   Specimen: Urine  Result Value Ref Range   Color, Urine YELLOW YELLOW   APPearance CLEAR CLEAR   Specific Gravity, Urine 1.026 1.001 - 1.035   pH 7.0 5.0 - 8.0   Glucose, UA NEGATIVE NEGATIVE   Bilirubin Urine NEGATIVE NEGATIVE   Ketones, ur TRACE (A) NEGATIVE   Hgb urine dipstick NEGATIVE NEGATIVE   Protein, ur NEGATIVE NEGATIVE   Nitrites, Initial NEGATIVE NEGATIVE   Leukocyte Esterase TRACE (A) NEGATIVE   WBC, UA 0-5 0 - 5 /HPF   RBC / HPF NONE SEEN 0 - 2 /HPF   Squamous Epithelial / HPF 0-5 < OR = 5 /HPF   Bacteria, UA NONE SEEN NONE SEEN /HPF   Calcium Oxalate Crystal FEW NONE OR FEW /HPF   Hyaline Cast NONE SEEN NONE SEEN /LPF   Note     REFLEXIVE URINE CULTURE   Collection Time: 03/09/23  4:19 PM  Result Value Ref Range   REFLEXIVE URINE CULTURE    Specimen status report   Collection Time: 03/09/23  4:19 PM  Result Value Ref Range   specimen status report Comment   Specimen status report   Collection Time: 03/09/23  4:19 PM  Result Value Ref Range   specimen status report Comment     Assessment & Plan:  There are no diagnoses linked to this encounter.   No orders of the defined types were placed in this encounter.   PATIENT COUNSELING: - Encouraged to adjust caloric intake to maintain or achieve ideal body weight, to reduce intake of dietary saturated fat and total fat, to limit sodium intake by avoiding high sodium foods and not adding table salt, and to maintain adequate dietary potassium and calcium preferably from fresh fruits, vegetables, and low-fat dairy products.   - Advised to avoid cigarette smoking. - Discussed with the patient that most people either abstain from alcohol or drink within safe limits (<=14/week and <=4 drinks/occasion for males, <=7/weeks and <= 3 drinks/occasion for females) and that the risk for alcohol disorders and other health effects rises proportionally with the number of drinks per week and how often a drinker exceeds daily limits. - Discussed cessation/primary prevention of drug use and availability of treatment for abuse.   - Stressed the importance of regular exercise - Injury prevention: Discussed safety belts, safety helmets, smoke detector, smoking near bedding or upholstery.  - Dental health: Discussed importance of regular tooth brushing, flossing, and dental visits.  - Sexuality: Discussed sexually transmitted diseases, partner selection, use of condoms, avoidance of unintended pregnancy  and contraceptive alternatives.   NEXT PREVENTATIVE PHYSICAL DUE IN 1 YEAR.  No follow-ups on file.  Salvatore Decent, FNP

## 2023-04-06 ENCOUNTER — Encounter: Payer: BC Managed Care – PPO | Admitting: Internal Medicine

## 2023-04-06 DIAGNOSIS — Z0000 Encounter for general adult medical examination without abnormal findings: Secondary | ICD-10-CM

## 2023-04-13 ENCOUNTER — Encounter: Payer: BC Managed Care – PPO | Admitting: Internal Medicine

## 2023-04-13 ENCOUNTER — Telehealth: Payer: Self-pay | Admitting: Internal Medicine

## 2023-04-13 NOTE — Telephone Encounter (Signed)
 04/06/2023 no show/same day cancellation - pt scheduled new appt for 04/13/2023 via mychart but did not cancel 2/28 visit or call prior to  1st missed visit, letter sent via [facility]

## 2023-04-16 NOTE — Telephone Encounter (Signed)
 Provider aware

## 2023-04-20 ENCOUNTER — Other Ambulatory Visit: Payer: Self-pay | Admitting: Internal Medicine

## 2023-04-20 DIAGNOSIS — J4520 Mild intermittent asthma, uncomplicated: Secondary | ICD-10-CM

## 2023-04-20 MED ORDER — ALBUTEROL SULFATE HFA 108 (90 BASE) MCG/ACT IN AERS: 2.0000 | INHALATION_SPRAY | RESPIRATORY_TRACT | 2 refills | Status: DC | PRN

## 2023-04-20 NOTE — Telephone Encounter (Signed)
 Copied from CRM [id number]. Topic: Clinical - Medication Refill >> Apr 20, 2023 11:58 AM Orinda Kenner C wrote: Most Recent Primary Care Visit:  Provider: Garnette Gunner  Department: [facility]  Visit Type: OFFICE VISIT  Date: 03/09/2023  Medication: (VENTOLIN HFA) 108 (90 Base) MCG/ACT   Has the patient contacted their pharmacy? No (Agent: If no, request that the patient contact the pharmacy for the refill. If patient does not wish to contact the pharmacy document the reason why and proceed with request.) (Agent: If yes, when and what did the pharmacy advise?)  Is this the correct pharmacy for this prescription? Yes If no, delete pharmacy and type the correct one.  This is the patient's preferred pharmacy:  [facility] [address], [place] - [address] [place] [address] Phone: [phone] Fax: [phone]   Has the prescription been filled recently? No  Is the patient out of the medication? Yes. Patient lost inhaler and cannot find it   Has the patient been seen for an appointment in the last year OR does the patient have an upcoming appointment? Yes, 05/03/23  Can we respond through MyChart? No, call [phone]  Agent: Please be advised that Rx refills may take up to 3 business days. We ask that you follow-up with your pharmacy.

## 2023-05-03 ENCOUNTER — Encounter: Admitting: Internal Medicine

## 2023-05-15 ENCOUNTER — Encounter: Admitting: Internal Medicine

## 2023-06-18 ENCOUNTER — Encounter: Payer: Self-pay | Admitting: Internal Medicine

## 2023-06-18 ENCOUNTER — Ambulatory Visit (INDEPENDENT_AMBULATORY_CARE_PROVIDER_SITE_OTHER): Admitting: Internal Medicine

## 2023-06-18 VITALS — BP 136/82 | HR 69 | Temp 98.0°F | Ht 67.5 in | Wt 187.2 lb

## 2023-06-18 DIAGNOSIS — Z0000 Encounter for general adult medical examination without abnormal findings: Secondary | ICD-10-CM | POA: Diagnosis not present

## 2023-06-18 DIAGNOSIS — Z13220 Encounter for screening for lipoid disorders: Secondary | ICD-10-CM | POA: Diagnosis not present

## 2023-06-18 DIAGNOSIS — J4520 Mild intermittent asthma, uncomplicated: Secondary | ICD-10-CM

## 2023-06-18 LAB — CBC WITH DIFFERENTIAL/PLATELET
Basophils Absolute: 0 10*3/uL (ref 0.0–0.1)
Basophils Relative: 0.8 % (ref 0.0–3.0)
Eosinophils Absolute: 0.1 10*3/uL (ref 0.0–0.7)
Eosinophils Relative: 2.5 % (ref 0.0–5.0)
HCT: 46.5 % (ref 39.0–52.0)
Hemoglobin: 15.6 g/dL (ref 13.0–17.0)
Lymphocytes Relative: 27.5 % (ref 12.0–46.0)
Lymphs Abs: 1.5 10*3/uL (ref 0.7–4.0)
MCHC: 33.6 g/dL (ref 30.0–36.0)
MCV: 87.5 fl (ref 78.0–100.0)
Monocytes Absolute: 0.3 10*3/uL (ref 0.1–1.0)
Monocytes Relative: 5.5 % (ref 3.0–12.0)
Neutro Abs: 3.4 10*3/uL (ref 1.4–7.7)
Neutrophils Relative %: 63.7 % (ref 43.0–77.0)
Platelets: 186 10*3/uL (ref 150.0–400.0)
RBC: 5.31 Mil/uL (ref 4.22–5.81)
RDW: 12.8 % (ref 11.5–15.5)
WBC: 5.3 10*3/uL (ref 4.0–10.5)

## 2023-06-18 LAB — LIPID PANEL
Cholesterol: 226 mg/dL — ABNORMAL HIGH (ref 0–200)
HDL: 70.3 mg/dL (ref >39.00)
LDL Cholesterol: 140 mg/dL — ABNORMAL HIGH (ref 0–99)
NonHDL: 155.31
Total CHOL/HDL Ratio: 3
Triglycerides: 78 mg/dL (ref 0.0–149.0)
VLDL: 15.6 mg/dL (ref 0.0–40.0)

## 2023-06-18 LAB — COMPREHENSIVE METABOLIC PANEL WITH GFR
ALT: 36 U/L (ref 0–53)
AST: 41 U/L — ABNORMAL HIGH (ref 0–37)
Albumin: 4.6 g/dL (ref 3.5–5.2)
Alkaline Phosphatase: 41 U/L (ref 39–117)
BUN: 20 mg/dL (ref 6–23)
CO2: 29 meq/L (ref 19–32)
Calcium: 9.3 mg/dL (ref 8.4–10.5)
Chloride: 101 meq/L (ref 96–112)
Creatinine, Ser: 1.43 mg/dL (ref 0.40–1.50)
GFR: 62.72 mL/min (ref >60.00)
Glucose, Bld: 83 mg/dL (ref 70–99)
Potassium: 3.8 meq/L (ref 3.5–5.1)
Sodium: 139 meq/L (ref 135–145)
Total Bilirubin: 0.6 mg/dL (ref 0.2–1.2)
Total Protein: 7.5 g/dL (ref 6.0–8.3)

## 2023-06-18 LAB — TSH: TSH: 1.91 u[IU]/mL (ref 0.35–5.50)

## 2023-06-18 MED ORDER — ALBUTEROL SULFATE HFA 108 (90 BASE) MCG/ACT IN AERS: 2.0000 | INHALATION_SPRAY | RESPIRATORY_TRACT | 3 refills | Status: DC | PRN

## 2023-06-18 NOTE — Progress Notes (Signed)
 Subjective:   Perry Johnston [birth year] 06/18/2023  CC: Chief Complaint  Patient presents with   Annual Exam    Fasting     HPI: Perry Johnston is a 38 y.o. male who presents for a routine health maintenance exam.  Labs collected at time of visit.   Needs refill of albuterol  inhaler for asthma.   HEALTH SCREENINGS: - PSA (50+): Not applicable  No results found for: "PSA1", "PSA"   - Colonoscopy (45+): Not applicable  - AAA Screening: Not applicable  Men age 72-75 who have ever smoked - Lung Cancer screening with low-dose CT: Not applicable-  Adults age 19-80 who are current cigarette smokers or quit within the last 15 years. Must have 20 pack year history.   Depression and Anxiety Screen done today and results listed below:     06/18/2023    2:27 PM 06/18/2023    2:26 PM 02/14/2023    9:04 AM  Depression screen PHQ 2/9  Decreased Interest  0 0  Down, Depressed, Hopeless  0 1  PHQ - 2 Score  0 1  Altered sleeping 0  0  Tired, decreased energy 0  1  Change in appetite 0  0  Feeling bad or failure about yourself  0  1  Trouble concentrating 0  1  Moving slowly or fidgety/restless 0  0  Suicidal thoughts 0  0  PHQ-9 Score   4  Difficult doing work/chores Not difficult at all  Not difficult at all    IMMUNIZATIONS:  - Tdap: Tetanus vaccination status reviewed: last tetanus booster within 10 years. - Influenza: Postponed to flu season   Past medical history, surgical history, medications, allergies, family history and social history reviewed with patient today and changes made to appropriate areas of the chart.   Past Medical History:  Diagnosis Date   Asthma     History reviewed. No pertinent surgical history.  No current outpatient medications on file prior to visit.   No current facility-administered medications on file prior to visit.    No Known Allergies   Social History   Socioeconomic History   Marital status: Unknown    Spouse name: Not on  file   Number of children: Not on file   Years of education: Not on file   Highest education level: Not on file  Occupational History   Not on file  Tobacco Use   Smoking status: Former    Types: Cigars    Quit date: 04/02/2020    Years since quitting: 3.2   Smokeless tobacco: Never  Vaping Use   Vaping status: Never Used  Substance and Sexual Activity   Alcohol use: Yes    Comment: socially   Drug use: Never   Sexual activity: Not Currently  Other Topics Concern   Not on file  Social History Narrative   Not on file   Social Drivers of Health   Financial Resource Strain: Low Risk  (01/23/2022)   [facility] from [facility]   Overall Financial Resource Strain (CARDIA)    Difficulty of Paying Living Expenses: Not very hard  Food Insecurity: No Food Insecurity (01/23/2022)   Received from [facility]   Hunger Vital Sign    Worried About Running Out of Food in the Last Year: Never true    Ran Out of Food in the Last Year: Never true  Transportation Needs: No Transportation Needs (08/17/2021)   Received from [facility] - Transportation  Lack of Transportation (Medical): No    Lack of Transportation (Non-Medical): No  Physical Activity: Sufficiently Active (01/23/2022)   Received from [facility]   Exercise Vital Sign    Days of Exercise per Week: 5 days    Minutes of Exercise per Session: 60 min  Stress: Not on file (12/14/2022)  Social Connections: Socially Integrated (01/23/2022)   Received from [facility]   Social Network    How would you rate your social network (family, work, friends)?: Good participation with social networks  Intimate Partner Violence: Not At Risk (01/23/2022)   Received from [facility]   HITS    Over the last 12 months how often did your partner physically hurt you?: Never    Over the last 12 months how often did your partner insult you or talk down to you?: Never    Over the last 12 months how often did your partner  threaten you with physical harm?: Never    Over the last 12 months how often did your partner scream or curse at you?: Never   Social History   Tobacco Use  Smoking Status Former   Types: Cigars   Quit date: 04/02/2020   Years since quitting: 3.2  Smokeless Tobacco Never   Social History   Substance and Sexual Activity  Alcohol Use Yes   Comment: socially     Family History  Problem Relation Age of Onset   Heart disease Mother    Heart disease Father      ROS: Denies fever, fatigue, unexplained weight loss/gain, hearing or vision changes, cardiac or respiratory complaints. Denies neurological deficits, musculoskeletal complaints, gastrointestinal or genitourinary complaints, mental health complaints, and skin changes.   Objective:   Today's Vitals   06/18/23 1428  BP: 136/82  Pulse: 69  Temp: 98 F (36.7 C)  TempSrc: Temporal  SpO2: 99%  Weight: 187 lb 3.2 oz (84.9 kg)  Height: 5' 7.5" (1.715 m)    GENERAL APPEARANCE: Well-appearing, in NAD. Well nourished.  SKIN: Pink, warm and dry. Turgor normal. No rash, lesion, ulceration, or ecchymoses. Hair evenly distributed.  HEENT: HEAD: Normocephalic.  EYES: PERRLA. EOMI. Lids intact w/o defect. Sclera white, Conjunctiva pink w/o exudate.  EARS: External ear w/o redness, swelling, masses or lesions. EAC clear. TM's intact, translucent w/o bulging, appropriate landmarks visualized. Appropriate acuity to conversational tones.  NOSE: Septum midline w/o deformity. Nares patent, mucosa pink and non-inflamed w/o drainage.  THROAT: Uvula midline. Oropharynx clear. Tonsils non-inflamed w/o exudate . Oral mucosa pink and moist.  NECK: Supple, Trachea midline. Full ROM w/o pain or tenderness. No lymphadenopathy. Thyroid non-tender w/o enlargement or palpable masses.  RESPIRATORY: Chest wall symmetrical w/o masses. Respirations even and non-labored. Breath sounds clear to auscultation bilaterally. No wheezes, rales, rhonchi, or  crackles. CARDIAC: S1, S2 present, regular rate and rhythm. No gallops, murmurs, rubs, or clicks. Capillary refill <2 seconds. Peripheral pulses 2+ bilaterally. GI: Abdomen soft w/o distention. Normoactive bowel sounds. No palpable masses or tenderness. No guarding or rebound tenderness. Liver and spleen w/o tenderness or enlargement. No CVA tenderness.  GU:  deferred exam. MSK: Muscle tone and strength appropriate for age, w/o atrophy or abnormal movement. EXTREMITIES: Active ROM intact, w/o tenderness, crepitus, or contracture. No obvious joint deformities or effusions. No clubbing, edema, or cyanosis.  NEUROLOGIC: CN's II-XII intact. Motor strength symmetrical with no obvious weakness. No sensory deficits. Steady, even gait.  PSYCH/MENTAL STATUS: Alert, oriented x 3. Cooperative, appropriate mood and affect.    Assessment &  Plan:  Encounter for general adult medical examination without abnormal findings -     CBC with Differential/Platelet -     Comprehensive metabolic panel with GFR -     Lipid panel -     TSH  Mild intermittent asthma, unspecified whether complicated -     Albuterol  Sulfate HFA; Inhale 2 puffs into the lungs every 4 (four) hours as needed for wheezing or shortness of breath.  Dispense: 18 g; Refill: 3    Orders Placed This Encounter  Procedures   CBC with Differential/Platelet   Comprehensive metabolic panel with GFR   Lipid panel   TSH    PATIENT COUNSELING: - Encouraged to adjust caloric intake to maintain or achieve ideal body weight, to reduce intake of dietary saturated fat and total fat, to limit sodium intake by avoiding high sodium foods and not adding table salt, and to maintain adequate dietary potassium and calcium preferably from fresh fruits, vegetables, and low-fat dairy products.   - Advised to avoid cigarette smoking. - Discussed with the patient that most people either abstain from alcohol or drink within safe limits (<=14/week and <=4  drinks/occasion for males, <=7/weeks and <= 3 drinks/occasion for females) and that the risk for alcohol disorders and other health effects rises proportionally with the number of drinks per week and how often a drinker exceeds daily limits. - Discussed cessation/primary prevention of drug use and availability of treatment for abuse.   - Stressed the importance of regular exercise  - Dental health: Discussed importance of regular tooth brushing, flossing, and dental visits.  - Sexuality: Discussed sexually transmitted diseases, partner selection, use of condoms, avoidance of unintended pregnancy  and contraceptive alternatives.   NEXT PREVENTATIVE PHYSICAL DUE IN 1 YEAR.  Return in about 1 year (around 06/17/2024) for Annual Physical Exam with fasting lab work.  Gavin Kast, FNP

## 2023-06-19 ENCOUNTER — Telehealth: Payer: Self-pay

## 2023-06-19 ENCOUNTER — Ambulatory Visit: Payer: Self-pay | Admitting: Internal Medicine

## 2023-06-19 NOTE — Telephone Encounter (Signed)
 Copied from CRM [id number]. Topic: Clinical - Request for Lab/Test Order >> Jun 19, 2023  1:37 PM [place] J wrote: Reason for CRM: Quita Bucks calling in from [facility] wanting to let the provider know that she will be faxing over lab orders that are needing a signature and would like a call back once that is completed.   Callback Number: [phone]

## 2023-06-19 NOTE — Telephone Encounter (Signed)
 Form received dated 03/09/23 lab results have been resulted no further action is needed.

## 2023-08-03 ENCOUNTER — Ambulatory Visit (INDEPENDENT_AMBULATORY_CARE_PROVIDER_SITE_OTHER): Admitting: Medical

## 2023-08-03 ENCOUNTER — Encounter

## 2023-08-03 VITALS — BP 144/100 | HR 74 | Resp 18 | Ht 67.5 in | Wt 187.8 lb

## 2023-08-03 DIAGNOSIS — Z113 Encounter for screening for infections with a predominantly sexual mode of transmission: Secondary | ICD-10-CM | POA: Diagnosis not present

## 2023-08-03 NOTE — Patient Instructions (Signed)
 Sexually Transmitted Infection Screening Concern about potential STI after recent sexual contact. Previous screening in January was negative. Updated screening warranted due to time elapsed and recent contact. - Order urine tests for gonorrhea, chlamydia, and trichomonas. - Order blood tests for HIV and RPR (syphilis). - Update on test results once available.  Follicle visible on penis. Normal variant Subtle, non-raised, prominent follicles on left side of penis nontender. Appearance consistent with prominent follicles, not infectious or dermatological condition. - Monitor for changes in appearance, such as raised lesions, blister formation, or ulceration. - Advise to report any new symptoms or changes in condition to PCP or another office if PCP is unavailable.  Follow as needed(regular scheduled with pcp)

## 2023-08-03 NOTE — Progress Notes (Signed)
   Subjective:    Patient ID: Perry Johnston, male    DOB: [birth year], 38 y.o.   MRN: [id number]  HPI Perry Johnston is a 38 year old male who presents for an STI check.  Approximately two weeks ago, he noticed a subtle change in the appearance of his penile on the left side, which became more noticeable today. There is no associated itching, burning, or discharge from the penis, and no testicular pain.  He had sexual contact with a partner a couple of weeks ago. Both he and his partner had negative STI screenings in January, including tests for gonorrhea, chlamydia, trichomonas, HIV, syphilis, hepatitis B, and hepatitis C. He shares his medical records and was 'clean' at that time.  No recent shaving of the area. He denies any recent sexual contact other than with his partner a couple of weeks ago.   Review of Systems See hpi    Objective:   Physical Exam   General- No acute distress. Pleasant patient. Neck- Full range of motion, no jvd Lungs- Clear, even and unlabored. Heart- regular rate and rhythm. GENITOURINARY: Follicles visible on the left side of the penis which are mildly prominent on close inspection. Area is not raised, no papules, no warts. No penile discharge. No testicular tenderness.rologic- CNII- XII grossly intact.      Assessment & Plan:   Patient Instructions  Sexually Transmitted Infection Screening Concern about potential STI after recent sexual contact. Previous screening in January was negative. Updated screening warranted due to time elapsed and recent contact. - Order urine tests for gonorrhea, chlamydia, and trichomonas. - Order blood tests for HIV and RPR (syphilis). - Update on test results once available.  Follicle visible on penis. Normal variant Subtle, non-raised, prominent follicles on left side of penis nontender. Appearance consistent with prominent follicles, not infectious or dermatological condition. - Monitor for changes in appearance, such  as raised lesions, blister formation, or ulceration. - Advise to report any new symptoms or changes in condition to PCP or another office if PCP is unavailable.  Follow as needed(regular scheduled with pcp)   Dallas Maxwell, PA-C  Patient Instructions  Sexually Transmitted Infection Screening Concern about potential STI after recent sexual contact. Previous screening in January was negative. Updated screening warranted due to time elapsed and recent contact. - Order urine tests for gonorrhea, chlamydia, and trichomonas. - Order blood tests for HIV and RPR (syphilis). - Update on test results once available.  Follicle visible on penis. Normal variant Subtle, non-raised, prominent follicles on left side of penis nontender. Appearance consistent with prominent follicles, not infectious or dermatological condition. - Monitor for changes in appearance, such as raised lesions, blister formation, or ulceration. - Advise to report any new symptoms or changes in condition to PCP or another office if PCP is unavailable.  Follow as needed(regular scheduled with pcp)   Dallas Maxwell, PA-C

## 2023-08-04 LAB — HIV ANTIBODY (ROUTINE TESTING W REFLEX): HIV 1&2 Ab, 4th Generation: NONREACTIVE

## 2023-08-04 LAB — C. TRACHOMATIS/N. GONORRHOEAE RNA
C. trachomatis RNA, TMA: NOT DETECTED
N. gonorrhoeae RNA, TMA: NOT DETECTED

## 2023-08-04 LAB — RPR: RPR Ser Ql: NONREACTIVE

## 2023-08-06 ENCOUNTER — Ambulatory Visit: Payer: Self-pay | Admitting: Medical

## 2023-08-09 ENCOUNTER — Ambulatory Visit: Admitting: Internal Medicine

## 2023-09-04 ENCOUNTER — Other Ambulatory Visit: Payer: Self-pay | Admitting: Internal Medicine

## 2023-09-04 DIAGNOSIS — J4520 Mild intermittent asthma, uncomplicated: Secondary | ICD-10-CM

## 2023-09-04 NOTE — Telephone Encounter (Signed)
 Copied from CRM #[id number]. Topic: Clinical - Medication Refill >> Sep 04, 2023  3:42 PM Pinkey ORN wrote: Medication: albuterol  (VENTOLIN  HFA) 108 (90 Base) MCG/ACT inhaler   Has the patient contacted their pharmacy? Yes (Agent: If no, request that the patient contact the pharmacy for the refill. If patient does not wish to contact the pharmacy document the reason why and proceed with request.) (Agent: If yes, when and what did the pharmacy advise?)  This is the patient's preferred pharmacy:  [id number] - [place], [place] - [address] [place] [address] Phone: [phone] Fax: [phone]   Is this the correct pharmacy for this prescription? Yes If no, delete pharmacy and type the correct one.   Has the prescription been filled recently? No  Is the patient out of the medication? Yes  Has the patient been seen for an appointment in the last year OR does the patient have an upcoming appointment? Yes  Can we respond through MyChart? Yes  Agent: Please be advised that Rx refills may take up to 3 business days. We ask that you follow-up with your pharmacy.

## 2023-09-05 MED ORDER — ALBUTEROL SULFATE HFA 108 (90 BASE) MCG/ACT IN AERS: 2.0000 | INHALATION_SPRAY | RESPIRATORY_TRACT | 3 refills | Status: DC | PRN

## 2023-09-27 ENCOUNTER — Telehealth: Admitting: Internal Medicine

## 2023-09-27 ENCOUNTER — Encounter: Payer: Self-pay | Admitting: Internal Medicine

## 2023-09-27 DIAGNOSIS — J069 Acute upper respiratory infection, unspecified: Secondary | ICD-10-CM

## 2023-09-27 NOTE — Progress Notes (Signed)
 [facility] [address] [place] [address] Dept: [phone] Dept Fax: [phone]  Virtual Video Visit  I connected with Perry Johnston on 09/27/23 at  2:40 PM EDT by a video enabled telemedicine application and verified that I am speaking with the correct person using two identifiers.   Location patient: Home Location provider: Clinic Total time: 8 minutes Persons participating in the virtual visit: Patient; Angelyn Hollingsworth CMA; Rosina Senters, FNP-C  I discussed the limitations of evaluation and management by telemedicine and the availability of in-person appointments. The patient expressed understanding and agreed to proceed.  Chief Complaint  Patient presents with   Facial Pain    Started yesterday,  woke up this morning and got worst taking advil sinus    SUBJECTIVE:  HPI:  Perry Johnston presents complaining of sinus congestion, intermittent cough, generalized weakness Onset: 1 day ago Associated symptoms: sinus pressure, slight body aches Denies: fever, chills, sore throat, CP, SHOB, worsening wheezing ( hx of asthma) , N/V/D.  Treatments tried: advil for sinus congestion - with some relief.   No known recent sick contacts.   The following portions of the patient's history were reviewed and updated as appropriate: medical history, surgical history, medications, allergies, social history, and family history.    Past Medical History:  Diagnosis Date   Asthma    History reviewed. No pertinent surgical history.   Current Outpatient Medications:    albuterol  (VENTOLIN  HFA) 108 (90 Base) MCG/ACT inhaler, Inhale 2 puffs into the lungs every 4 (four) hours as needed for wheezing or shortness of breath., Disp: 18 g, Rfl: 3 No Known Allergies  Social History   Socioeconomic History   Marital status: Unknown    Spouse name: Not on file   Number of children: Not on file   Years of education: Not on file    Highest education level: Not on file  Occupational History   Not on file  Tobacco Use   Smoking status: Former    Types: Cigars    Quit date: 04/02/2020    Years since quitting: 3.4   Smokeless tobacco: Never  Vaping Use   Vaping status: Never Used  Substance and Sexual Activity   Alcohol use: Yes    Comment: socially   Drug use: Never   Sexual activity: Not Currently  Other Topics Concern   Not on file  Social History Narrative   Not on file   Social Drivers of Health   Financial Resource Strain: Low Risk  (01/23/2022)   Received from [facility]   Overall Financial Resource Strain (CARDIA)    Difficulty of Paying Living Expenses: Not very hard  Food Insecurity: No Food Insecurity (01/23/2022)   Received from [facility]   Hunger Vital Sign    Within the past 12 months, you worried that your food would run out before you got the money to buy more.: Never true    Within the past 12 months, the food you bought just didn't last and you didn't have money to get more.: Never true  Transportation Needs: No Transportation Needs (08/17/2021)   Received from [facility] - Transportation    Lack of Transportation (Medical): No    Lack of Transportation (Non-Medical): No  Physical Activity: Sufficiently Active (01/23/2022)   Received from [facility]   Exercise Vital Sign    On average, how many days per week do you engage in moderate to strenuous exercise (like a brisk  walk)?: 5 days    On average, how many minutes do you engage in exercise at this level?: 60 min  Stress: Not on file (12/14/2022)  Social Connections: Socially Integrated (01/23/2022)   Received from [facility]   Social Network    How would you rate your social network (family, work, friends)?: Good participation with social networks  Intimate Partner Violence: Not At Risk (01/23/2022)   Received from [facility]   HITS    Over the last 12 months how often did your partner physically hurt  you?: Never    Over the last 12 months how often did your partner insult you or talk down to you?: Never    Over the last 12 months how often did your partner threaten you with physical harm?: Never    Over the last 12 months how often did your partner scream or curse at you?: Never    Family History  Problem Relation Age of Onset   Heart disease Mother    Heart disease Father      ROS: A complete ROS was performed with pertinent positives/negatives noted in the HPI. The remainder of the ROS are negative.    OBJECTIVE:  VITALS per patient if applicable: There were no vitals filed for this visit. There is no height or weight on file to calculate BMI.   GENERAL: Alert and oriented. Appears well and in no acute distress. HEENT: Atraumatic. Conjunctiva clear. No obvious abnormalities on inspection of external nose and ears. NECK: Normal movements of the head and neck. LUNGS: On inspection, no signs of respiratory distress. Breathing rate appears normal. No obvious gross SOB, gasping or wheezing, and no conversational dyspnea. CV: No obvious cyanosis. MS: Moves all visible extremities without noticeable abnormality. PSYCH/NEURO: Pleasant and cooperative. No obvious depression or anxiety. Speech and thought processing grossly intact.  ASSESSMENT AND PLAN: 1. Upper respiratory tract infection, unspecified type (Primary) - discussed continuing the Advil sinus congestion/cold relief (ibuprofen, pseudoephedrine, chlorpheniramine) - Sinus rinse (nedipot, NeilMed)  - tylenol PRN  - rest, push fluids  - advised going to pharmacy to pick up a COVID test - patient will notify if + and if he would like to start paxlovid.    I discussed the assessment and treatment plan with the patient. The patient was provided an opportunity to ask questions and all were answered. The patient agreed with the plan and demonstrated an understanding of the instructions.   The patient was advised to call back  or seek an in-person evaluation if the symptoms worsen or if the condition fails to improve as anticipated.  Return if symptoms worsen or fail to improve.  Rosina Senters, FNP

## 2023-11-08 ENCOUNTER — Ambulatory Visit
Admission: EM | Admit: 2023-11-08 | Discharge: 2023-11-08 | Disposition: A | Discharge status: Home / Self Care | Attending: Student | Admitting: Student

## 2023-11-08 ENCOUNTER — Ambulatory Visit: Admitting: Internal Medicine

## 2023-11-08 ENCOUNTER — Encounter: Payer: Self-pay | Admitting: Emergency Medicine

## 2023-11-08 ENCOUNTER — Ambulatory Visit: Payer: Self-pay

## 2023-11-08 DIAGNOSIS — R300 Dysuria: Secondary | ICD-10-CM | POA: Diagnosis not present

## 2023-11-08 LAB — POCT URINE DIPSTICK
Bilirubin, UA: NEGATIVE
Glucose, UA: NEGATIVE mg/dL
Ketones, POC UA: NEGATIVE mg/dL
Leukocytes, UA: NEGATIVE
Nitrite, UA: NEGATIVE
POC PROTEIN,UA: 30 — AB
Spec Grav, UA: 1.02 (ref 1.010–1.025)
Urobilinogen, UA: 1 U/dL
pH, UA: 8 (ref 5.0–8.0)

## 2023-11-08 MED ORDER — TAMSULOSIN HCL 0.4 MG PO CAPS: 0.4000 mg | ORAL_CAPSULE | Freq: Every day | ORAL | 0 refills | Status: AC

## 2023-11-08 NOTE — ED Triage Notes (Signed)
 Pt states Sunday night had sharp pain in lower back that lasted about 5 mins. On Monday he began having urinary frequency with slight burning sensation.

## 2023-11-08 NOTE — Discharge Instructions (Addendum)
-   We are testing for sexually transmitted infections, and will call in about 3 days if any of this is positive. - Drink plenty of fluids. - There is a small chance that you have a kidney stone.  I am sending a medication called Flomax, which will help the stone pass.  If your symptoms get worse, including worsening pain that radiates from the back to the groin, or blood in the toilet, you can start this medication.  If your symptoms are changing or getting worse, including worsening pain when you pee, new fevers, etc.-seek additional medical attention.

## 2023-11-08 NOTE — ED Provider Notes (Signed)
 [facility]    CSN: [id number] Arrival date & time: 11/08/23  1352      History   Chief Complaint Chief Complaint  Patient presents with   Urinary Frequency    HPI Perry Johnston is a 38 y.o. male presenting with 5 days of urinary symptoms.  Initially, he noted a sharp pain radiating from the lower back towards the side, this lasted for 5 minutes and terminated on its own.  Over the next few days, he notes an urge to urinate, but small voids.  After voiding, he has a tingling sensation in the urethra.  He denies dysuria, penile discharge, abdominal pain, denies current back pain.  Denies prior history of kidney stone   HPI  Past Medical History:  Diagnosis Date   Asthma     Patient Active Problem List   Diagnosis Date Noted   Elevated BP without diagnosis of hypertension 02/14/2023   Mild intermittent asthma without complication 10/11/2022    History reviewed. No pertinent surgical history.     Home Medications    Prior to Admission medications   Medication Sig Start Date End Date Taking? Authorizing Provider  tamsulosin (FLOMAX) 0.4 MG CAPS capsule Take 1 capsule (0.4 mg total) by mouth daily for 5 days. 11/08/23 11/13/23 Yes Maegen Wigle E, PA-C  albuterol  (VENTOLIN  HFA) 108 (90 Base) MCG/ACT inhaler Inhale 2 puffs into the lungs every 4 (four) hours as needed for wheezing or shortness of breath. 09/05/23   Billy Knee, FNP    Family History Family History  Problem Relation Age of Onset   Heart disease Mother    Heart disease Father     Social History Social History   Tobacco Use   Smoking status: Former    Types: Cigars    Quit date: 04/02/2020    Years since quitting: 3.6   Smokeless tobacco: Never  Vaping Use   Vaping status: Never Used  Substance Use Topics   Alcohol use: Yes    Comment: socially   Drug use: Never     Allergies   Patient has no known allergies.   Review of Systems Review of Systems  Constitutional:   Negative for chills and fever.  HENT:  Negative for sore throat.   Eyes:  Negative for pain and redness.  Respiratory:  Negative for shortness of breath.   Cardiovascular:  Negative for chest pain.  Gastrointestinal:  Negative for abdominal pain, diarrhea, nausea and vomiting.  Genitourinary:  Negative for decreased urine volume, difficulty urinating, dysuria, flank pain, frequency, genital sores, hematuria and urgency.  Musculoskeletal:  Negative for back pain.  Skin:  Negative for rash.     Physical Exam Triage Vital Signs ED Triage Vitals  Encounter Vitals Group     BP 11/08/23 1358 (!) 170/94     Girls Systolic BP Percentile --      Girls Diastolic BP Percentile --      Boys Systolic BP Percentile --      Boys Diastolic BP Percentile --      Pulse Rate 11/08/23 1356 88     Resp 11/08/23 1356 16     Temp 11/08/23 1358 (!) 97 F (36.1 C)     Temp Source 11/08/23 1356 Oral     SpO2 11/08/23 1356 98 %     Weight --      Height --      Head Circumference --      Peak Flow --  Pain Score --      Pain Loc --      Pain Education --      Exclude from Growth Chart --    No data found.  Updated Vital Signs BP (!) 170/94 (BP Location: Right Arm)   Pulse 88   Temp (!) 97 F (36.1 C)   Resp 16   SpO2 98%   Visual Acuity Right Eye Distance:   Left Eye Distance:   Bilateral Distance:    Right Eye Near:   Left Eye Near:    Bilateral Near:     Physical Exam Vitals reviewed.  Constitutional:      General: He is not in acute distress.    Appearance: Normal appearance. He is not ill-appearing.  HENT:     Head: Normocephalic and atraumatic.     Mouth/Throat:     Mouth: Mucous membranes are moist.     Comments: Moist mucous membranes Eyes:     Extraocular Movements: Extraocular movements intact.     Pupils: Pupils are equal, round, and reactive to light.  Cardiovascular:     Rate and Rhythm: Normal rate and regular rhythm.     Heart sounds: Normal heart sounds.   Pulmonary:     Effort: Pulmonary effort is normal.     Breath sounds: Normal breath sounds. No wheezing, rhonchi or rales.  Abdominal:     General: Bowel sounds are normal. There is no distension.     Palpations: Abdomen is soft. There is no mass.     Tenderness: There is no abdominal tenderness. There is no right CVA tenderness, left CVA tenderness, guarding or rebound.  Skin:    General: Skin is warm.     Capillary Refill: Capillary refill takes less than 2 seconds.     Comments: Good skin turgor  Neurological:     General: No focal deficit present.     Mental Status: He is alert and oriented to person, place, and time.  Psychiatric:        Mood and Affect: Mood normal.        Behavior: Behavior normal.      UC Treatments / Results  Labs (all labs ordered are listed, but only abnormal results are displayed) Labs Reviewed  POCT URINE DIPSTICK - Abnormal; Notable for the following components:      Result Value   Blood, UA trace-intact (*)    POC PROTEIN,UA =30 (*)    All other components within normal limits  CYTOLOGY, (ORAL, ANAL, URETHRAL) ANCILLARY ONLY    EKG   Radiology No results found.  Procedures Procedures (including critical care time)  Medications Ordered in UC Medications - No data to display  Initial Impression / Assessment and Plan / UC Course  I have reviewed the triage vital signs and the nursing notes.  Pertinent labs & imaging results that were available during my care of the patient were reviewed by me and considered in my medical decision making (see chart for details).     Ddx is nephrolithiasis vs STI vs other.  UA with trace blood, negative leuk, negative nitrite.  We are also checking a cytology swab for gonorrhea, chlamydia, trichomonas.  We discussed that a kidney stone could explain his symptoms.  At present, his symptoms are mild, but I will send Flomax to have on hand if symptoms get worse, with strict return precautions: New fevers,  abdominal pain, back pain, penile symptoms-seek additional medical treatment.  Final Clinical Impressions(s) / UC  Diagnoses   Final diagnoses:  Dysuria     Discharge Instructions      - We are testing for sexually transmitted infections, and will call in about 3 days if any of this is positive. - Drink plenty of fluids. - There is a small chance that you have a kidney stone.  I am sending a medication called Flomax, which will help the stone pass.  If your symptoms get worse, including worsening pain that radiates from the back to the groin, or blood in the toilet, you can start this medication.  If your symptoms are changing or getting worse, including worsening pain when you pee, new fevers, etc.-seek additional medical attention.     ED Prescriptions     Medication Sig Dispense Auth. Provider   tamsulosin (FLOMAX) 0.4 MG CAPS capsule Take 1 capsule (0.4 mg total) by mouth daily for 5 days. 5 capsule Maxmilian Trostel E, PA-C      PDMP not reviewed this encounter.   Arlyss Leita BRAVO, PA-C 11/08/23 1451

## 2023-11-08 NOTE — Telephone Encounter (Signed)
 FYI Only or Action Required?: FYI only for provider.  Patient was last seen in primary care on 09/27/2023 by Billy Knee, FNP.  Called Nurse Triage reporting Urinary Retention.  Symptoms began a week ago.  Interventions attempted: Nothing.  Symptoms are: gradually worsening.  Triage Disposition: See Physician Within 24 Hours  Patient/caregiver understands and will follow disposition?: No   RN offered appt with Np in office tomorrow. Pt refused due to the specific provider. Pt requested urgent care. RN made appt at [facility] location  Copied from CRM [id number]. Topic: Clinical - Red Word Triage >> Nov 08, 2023 11:43 AM Adelita E wrote: [facility] that prompted transfer to Nurse Triage: Lower back pain, patient also stated that it is now urinary symptoms involving a tingling sensation after voiding and the urgency to go even though he does not actually have to. Reason for Disposition  Urinating more frequently than usual (i.e., frequency) OR new-onset of the feeling of an urgent need to urinate (i.e., urgency)  Answer Assessment - Initial Assessment Questions Pt states no longer has low back pain. He states now he is having urinary symptoms. Biggest concern is tingling after urination.    1. SYMPTOM: What's the main symptom you're concerned about? (e.g., frequency, incontinence)     Tingling after urination.  2. ONSET: When did the  tingling  start?     Tuesday 3. PAIN: Is there any pain? If Yes, ask: How bad is it? (Scale: 1-10; mild, moderate, severe)     No pain 4. CAUSE: What do you think is causing the symptoms?     unsure 5. OTHER SYMPTOMS: Do you have any other symptoms? (e.g., blood in urine, fever, flank pain, pain with urination)     Tingling feeling after urinary, no discharge, feel the urge to go but sometimes don't actually have to go.  Protocols used: Urinary Symptoms-A-AH

## 2023-11-09 LAB — CYTOLOGY, (ORAL, ANAL, URETHRAL) ANCILLARY ONLY
Chlamydia: NEGATIVE
Comment: NEGATIVE
Comment: NEGATIVE
Comment: NORMAL
Neisseria Gonorrhea: NEGATIVE
Trichomonas: NEGATIVE

## 2023-11-23 ENCOUNTER — Encounter: Payer: Self-pay | Admitting: Internal Medicine

## 2023-12-10 ENCOUNTER — Other Ambulatory Visit (HOSPITAL_COMMUNITY)
Admission: RE | Admit: 2023-12-10 | Discharge: 2023-12-10 | Disposition: A | Discharge status: Home / Self Care | Source: Ambulatory Visit | Attending: Internal Medicine | Admitting: Internal Medicine

## 2023-12-10 ENCOUNTER — Encounter: Payer: Self-pay | Admitting: Internal Medicine

## 2023-12-10 ENCOUNTER — Ambulatory Visit (INDEPENDENT_AMBULATORY_CARE_PROVIDER_SITE_OTHER): Admitting: Internal Medicine

## 2023-12-10 VITALS — BP 165/100 | HR 112 | Temp 98.0°F | Ht 67.0 in | Wt 189.0 lb

## 2023-12-10 DIAGNOSIS — R369 Urethral discharge, unspecified: Secondary | ICD-10-CM

## 2023-12-10 MED ORDER — CEFTRIAXONE SODIUM 500 MG IJ SOLR: 500.0000 mg | Freq: Once | INTRAMUSCULAR | Status: DC

## 2023-12-10 MED ORDER — DOXYCYCLINE HYCLATE 100 MG PO CAPS: 100.0000 mg | ORAL_CAPSULE | Freq: Two times a day (BID) | ORAL | 0 refills | Status: AC

## 2023-12-10 MED ORDER — CEFTRIAXONE SODIUM 1 G IJ SOLR
1.0000 g | Freq: Once | INTRAMUSCULAR | Status: AC
  Administered 2023-12-10: 1 g via INTRAMUSCULAR

## 2023-12-10 NOTE — Progress Notes (Unsigned)
 [facility]IMARY CARE [facility] [address] [place] [address] Dept: [phone] Dept Fax: [phone]  Acute Care Office Visit  Subjective:   Perry Johnston Jan 08, 1986 12/10/2023  Chief Complaint  Patient presents with   Exposure to STD    Wanting to be checked exposed a  week ago    HPI:  Discussed the use of AI scribe software for clinical note transcription with the patient, who gave verbal consent to proceed.  History of Present Illness   Perry Johnston is a 38 year old male who presents for an STD check following a recent unprotected sexual encounter.  He noticed a small amount of penile discharge this morning, which prompted him to seek medical attention. Approximately one week ago, he had a new sexual partner and engaged in unprotected intercourse. His partner contacted him yesterday, raising concerns about potential exposure to a sexually transmitted infection.  No burning sensation during urination, discomfort, foul odor in urine, rash on the genital area, or abdominal pain. The discharge was not yellow in color. He has not experienced any other symptoms apart from the discharge noticed today.       The following portions of the patient's history were reviewed and updated as appropriate: past medical history, past surgical history, family history, social history, allergies, medications, and problem list.   Patient Active Problem List   Diagnosis Date Noted   Elevated BP without diagnosis of hypertension 02/14/2023   Mild intermittent asthma without complication 10/11/2022   Past Medical History:  Diagnosis Date   Asthma    History reviewed. No pertinent surgical history. Family History  Problem Relation Age of Onset   Heart disease Mother    Heart disease Father     Current Outpatient Medications:    doxycycline (VIBRAMYCIN) 100 MG capsule, Take 1 capsule (100 mg total) by mouth 2 (two) times daily for 7 days.,  Disp: 14 capsule, Rfl: 0   albuterol  (VENTOLIN  HFA) 108 (90 Base) MCG/ACT inhaler, Inhale 2 puffs into the lungs every 4 (four) hours as needed for wheezing or shortness of breath., Disp: 18 g, Rfl: 3 No Known Allergies   ROS: A complete ROS was performed with pertinent positives/negatives noted in the HPI. The remainder of the ROS are negative.    Objective:   Today's Vitals   12/10/23 1447  BP: (!) 165/100  Pulse: (!) 112  Temp: 98 F (36.7 C)  TempSrc: Temporal  SpO2: 98%  Weight: 189 lb (85.7 kg)  Height: 5' 7 (1.702 m)    GENERAL: Well-appearing, in NAD. Well nourished.  SKIN: Pink, warm and dry. No rash, lesion. RESPIRATORY: Chest wall symmetrical. Respirations even and non-labored. NEUROLOGIC:  Steady, even gait.  PSYCH/MENTAL STATUS: Alert, oriented x 3. Cooperative, appropriate mood and affect.     Assessment & Plan:  Assessment and Plan    Urethral discharge, possible sexually transmitted infection Urethral discharge post-unprotected intercourse. - Obtained urine sample for gonorrhea, chlamydia, trichomonas testing.Declines HIV, Hep C, RPR testing.  - Administer Rocephin 500mg  for potential gonorrhea. - Prescribe doxycycline 100mg  BID x 7 days for potential chlamydia, pending test results.  - Advised no sexual activity x 7 days and until symptom resolution  - Instructed to return if symptoms persist.      Meds ordered this encounter  Medications   doxycycline (VIBRAMYCIN) 100 MG capsule    Sig: Take 1 capsule (100 mg total) by mouth 2 (two) times daily for 7 days.    Dispense:  14 capsule    Refill:  0    Supervising Provider:   THOMPSON, AARON B [[id number]]   DISCONTD: cefTRIAXone (ROCEPHIN) injection 500 mg   cefTRIAXone (ROCEPHIN) injection 1 g   No orders of the defined types were placed in this encounter.  Lab Orders  No laboratory test(s) ordered today   No images are attached to the encounter or orders placed in the encounter.  Return if  symptoms worsen or fail to improve.   Rosina Senters, FNP

## 2023-12-12 LAB — URINE CYTOLOGY ANCILLARY ONLY
Chlamydia: NEGATIVE
Comment: NEGATIVE
Comment: NEGATIVE
Comment: NORMAL
Neisseria Gonorrhea: POSITIVE — AB
Trichomonas: NEGATIVE

## 2023-12-13 ENCOUNTER — Ambulatory Visit: Payer: Self-pay | Admitting: Internal Medicine

## 2024-01-07 ENCOUNTER — Ambulatory Visit: Payer: Self-pay

## 2024-01-09 ENCOUNTER — Ambulatory Visit

## 2024-01-21 ENCOUNTER — Other Ambulatory Visit: Payer: Self-pay | Admitting: Internal Medicine

## 2024-01-21 DIAGNOSIS — J4520 Mild intermittent asthma, uncomplicated: Secondary | ICD-10-CM

## 2024-01-21 MED ORDER — ALBUTEROL SULFATE HFA 108 (90 BASE) MCG/ACT IN AERS: 2.0000 | INHALATION_SPRAY | RESPIRATORY_TRACT | 3 refills | Status: AC | PRN

## 2024-01-21 NOTE — Telephone Encounter (Signed)
 Copied from CRM #[id number]. Topic: Clinical - Medication Refill >> Jan 21, 2024  2:52 PM Victoria A wrote: Medication: albuterol  (VENTOLIN  HFA) 108 (90 Base) MCG/ACT inhaler  Has the patient contacted their pharmacy? No (Agent: If no, request that the patient contact the pharmacy for the refill. If patient does not wish to contact the pharmacy document the reason why and proceed with request.) (Agent: If yes, when and what did the pharmacy advise?)  This is the patient's preferred pharmacy:  [id number] - [place], [place] - [address] [place] [address] Phone: [phone] Fax: [phone]   Is this the correct pharmacy for this prescription? Yes If no, delete pharmacy and type the correct one.   Has the prescription been filled recently? No Is the patient out of the medication? Yes  Has the patient been seen for an appointment in the last year OR does the patient have an upcoming appointment? Yes  Can we respond through MyChart? Yes  Agent: Please be advised that Rx refills may take up to 3 business days. We ask that you follow-up with your pharmacy.

## 2024-01-21 NOTE — Telephone Encounter (Signed)
 Requesting: albuterol  (VENTOLIN  HFA) 108 (90 Base) MCG/ACT inhaler  Last Visit: 12/10/2023 Next Visit: Visit date not found Last Refill: 09/05/2023  Please Advise

## 2024-01-29 ENCOUNTER — Encounter: Payer: Self-pay | Admitting: Internal Medicine

## 2024-01-29 ENCOUNTER — Other Ambulatory Visit (HOSPITAL_COMMUNITY)
Admission: RE | Admit: 2024-01-29 | Discharge: 2024-01-29 | Disposition: A | Discharge status: Home / Self Care | Source: Ambulatory Visit | Attending: Internal Medicine | Admitting: Internal Medicine

## 2024-01-29 ENCOUNTER — Ambulatory Visit: Admitting: Internal Medicine

## 2024-01-29 VITALS — BP 129/83 | HR 90 | Temp 97.8°F | Ht 67.0 in | Wt 194.2 lb

## 2024-01-29 DIAGNOSIS — Z113 Encounter for screening for infections with a predominantly sexual mode of transmission: Secondary | ICD-10-CM | POA: Diagnosis not present

## 2024-01-29 NOTE — Progress Notes (Signed)
" °  [facility]IMARY CARE [facility] [address] [place] [address] Dept: [phone] Dept Fax: [phone]  Acute Care Office Visit  Subjective:   Jarom Govan [birth year] 01/29/2024  Chief Complaint  Patient presents with   Exposure to STD    Wanting to be checked     HPI:  STD SCREENING: Khaden Gater presents for STD screening.   Sexual activity:  yes - uses condoms for protection  Recent unprotected intercourse: no Recent known exposure to STD's: no History of sexually transmitted diseases: no  Genital lesions: no Genital discharge: no Dysuria: no Fevers: no Rash: no     The following portions of the patient's history were reviewed and updated as appropriate: past medical history, past surgical history, family history, social history, allergies, medications, and problem list.   Patient Active Problem List   Diagnosis Date Noted   Elevated BP without diagnosis of hypertension 02/14/2023   Mild intermittent asthma without complication 10/11/2022   Past Medical History:  Diagnosis Date   Asthma    History reviewed. No pertinent surgical history. Family History  Problem Relation Age of Onset   Heart disease Mother    Heart disease Father    Current Medications[1] Allergies[2]   ROS: A complete ROS was performed with pertinent positives/negatives noted in the HPI. The remainder of the ROS are negative.    Objective:   Today's Vitals   01/29/24 1518 01/29/24 1543  BP: (!) 140/100 129/83  Pulse: 90   Temp: 97.8 F (36.6 C)   TempSrc: Temporal   SpO2: 97%   Weight: 194 lb 3.2 oz (88.1 kg)   Height: 5' 7 (1.702 m)     GENERAL: Well-appearing, in NAD. Well nourished.  SKIN: Pink, warm and dry.  RESPIRATORY: Chest wall symmetrical. Respirations even and non-labored. PSYCH/MENTAL STATUS: Alert, oriented x 3. Cooperative, appropriate mood and affect.    No results found for any visits on 01/29/24.     Assessment & Plan:  1. Screening for STD (sexually transmitted disease) (Primary) - Urine cytology ancillary only - Declines HIV, Hep C, RPR   No orders of the defined types were placed in this encounter.  No orders of the defined types were placed in this encounter.  Lab Orders  No laboratory test(s) ordered today   No images are attached to the encounter or orders placed in the encounter.  Return in about 6 months (around 07/29/2024) for Annual Physical Exam with fasting lab work.   Rosina Senters, FNP     [1]  Current Outpatient Medications:    albuterol  (VENTOLIN  HFA) 108 (90 Base) MCG/ACT inhaler, Inhale 2 puffs into the lungs every 4 (four) hours as needed for wheezing or shortness of breath., Disp: 18 g, Rfl: 3 [2] No Known Allergies  "

## 2024-01-29 NOTE — Patient Instructions (Signed)
 BLOOD PRESSURE: Obtain an automatic blood pressure machine if you do not have one.   Check your blood pressure at home, write down blood pressure readings and bring to next appointment.  Goal is BP less than 130/80 consistently.  Adhere to a low salt diet ( no more than 1500mg  of salt/sodium per day) and exercise regularly   The nutrition facts label is a good place to find how much sodium is in foods. Look for products with no more than 400 mg of sodium per serving.  Remember that 1.5 g = 1500 mg.  The food label may also list foods as:  Sodium-free: Less than 5 mg in a serving.  Very low sodium: 35 mg or less in a serving.  Low-sodium: 140 mg or less in a serving.  Light in sodium: 50% less sodium in a serving. For example, if a food that usually has 300 mg of sodium is changed to become light in sodium, it will have 150 mg of sodium.  Reduced sodium: 25% less sodium in a serving. For example, if a food that usually has 400 mg of sodium is changed to reduced sodium, it will have 300 mg of sodium.

## 2024-02-01 LAB — URINE CYTOLOGY ANCILLARY ONLY
Chlamydia: NEGATIVE
Comment: NEGATIVE
Comment: NEGATIVE
Comment: NORMAL
Neisseria Gonorrhea: NEGATIVE
Trichomonas: NEGATIVE

## 2024-02-04 ENCOUNTER — Ambulatory Visit: Payer: Self-pay | Admitting: Internal Medicine

## 2024-02-14 ENCOUNTER — Other Ambulatory Visit: Payer: Self-pay | Admitting: Internal Medicine

## 2024-02-14 DIAGNOSIS — J4520 Mild intermittent asthma, uncomplicated: Secondary | ICD-10-CM

## 2024-02-14 NOTE — Telephone Encounter (Signed)
 Called pharmacy and they had refills there and will get it ready for patient.  patient notified VIA phone. Dm/cma

## 2024-02-14 NOTE — Telephone Encounter (Signed)
 Copied from CRM [id number]. Topic: Clinical - Medication Refill >> Feb 14, 2024 10:10 AM Deaijah H wrote: Medication: albuterol  (VENTOLIN  HFA) 108 (90 Base) MCG/ACT inhaler  Has the patient contacted their pharmacy? Yes (Agent: If no, request that the patient contact the pharmacy for the refill. If patient does not wish to contact the pharmacy document the reason why and proceed with request.) They can't would have to contact Dr  (Agent: If yes, when and what did the pharmacy advise?)  This is the patient's preferred pharmacy:  [id number] - [place], [place] - [address] [place] [address] Phone: [phone] Fax: [phone]   Is this the correct pharmacy for this prescription? Yes If no, delete pharmacy and type the correct one.   Has the prescription been filled recently? Yes  Is the patient out of the medication? Yes  Has the patient been seen for an appointment in the last year OR does the patient have an upcoming appointment? Yes  Can we respond through MyChart? Yes  Agent: Please be advised that Rx refills may take up to 3 business days. We ask that you follow-up with your pharmacy.

## 2024-07-29 ENCOUNTER — Encounter: Admitting: Internal Medicine
